# Patient Record
Sex: Male | Born: 1990 | Hispanic: No | Marital: Single | Smoking: Current every day smoker
Health system: Southern US, Community
[De-identification: ages and names within clinical notes are randomized; demographics above are authoritative.]

## PROBLEM LIST (undated history)

## (undated) DIAGNOSIS — Z87442 Personal history of urinary calculi: Secondary | ICD-10-CM

## (undated) DIAGNOSIS — N200 Calculus of kidney: Secondary | ICD-10-CM

## (undated) HISTORY — PX: URETEROLITHOTOMY: SHX71

## (undated) HISTORY — PX: KIDNEY STONE SURGERY: SHX686

---

## 2021-06-28 ENCOUNTER — Emergency Department (HOSPITAL_BASED_OUTPATIENT_CLINIC_OR_DEPARTMENT_OTHER)
Admission: EM | Admit: 2021-06-28 | Discharge: 2021-06-29 | Disposition: A | Discharge status: Home / Self Care | Payer: Self-pay | Attending: Emergency Medicine | Admitting: Emergency Medicine

## 2021-06-28 ENCOUNTER — Other Ambulatory Visit: Payer: Self-pay

## 2021-06-28 ENCOUNTER — Encounter (HOSPITAL_BASED_OUTPATIENT_CLINIC_OR_DEPARTMENT_OTHER): Payer: Self-pay

## 2021-06-28 ENCOUNTER — Emergency Department (HOSPITAL_BASED_OUTPATIENT_CLINIC_OR_DEPARTMENT_OTHER): Payer: Self-pay

## 2021-06-28 DIAGNOSIS — M5020 Other cervical disc displacement, unspecified cervical region: Secondary | ICD-10-CM

## 2021-06-28 DIAGNOSIS — M5031 Other cervical disc degeneration,  high cervical region: Secondary | ICD-10-CM | POA: Insufficient documentation

## 2021-06-28 DIAGNOSIS — R269 Unspecified abnormalities of gait and mobility: Secondary | ICD-10-CM

## 2021-06-28 DIAGNOSIS — Z20822 Contact with and (suspected) exposure to covid-19: Secondary | ICD-10-CM | POA: Insufficient documentation

## 2021-06-28 DIAGNOSIS — M5021 Other cervical disc displacement,  high cervical region: Secondary | ICD-10-CM | POA: Insufficient documentation

## 2021-06-28 DIAGNOSIS — R791 Abnormal coagulation profile: Secondary | ICD-10-CM | POA: Insufficient documentation

## 2021-06-28 DIAGNOSIS — F17210 Nicotine dependence, cigarettes, uncomplicated: Secondary | ICD-10-CM | POA: Insufficient documentation

## 2021-06-28 DIAGNOSIS — M50223 Other cervical disc displacement at C6-C7 level: Secondary | ICD-10-CM | POA: Insufficient documentation

## 2021-06-28 DIAGNOSIS — R202 Paresthesia of skin: Secondary | ICD-10-CM

## 2021-06-28 DIAGNOSIS — Z79899 Other long term (current) drug therapy: Secondary | ICD-10-CM | POA: Insufficient documentation

## 2021-06-28 HISTORY — DX: Calculus of kidney: N20.0

## 2021-06-28 LAB — RESP PANEL BY RT-PCR (FLU A&B, COVID) ARPGX2
Influenza A by PCR: NEGATIVE
Influenza B by PCR: NEGATIVE
SARS Coronavirus 2 by RT PCR: NEGATIVE

## 2021-06-28 LAB — COMPREHENSIVE METABOLIC PANEL
ALT: 52 U/L — ABNORMAL HIGH (ref 0–44)
AST: 38 U/L (ref 15–41)
Albumin: 4.3 g/dL (ref 3.5–5.0)
Alkaline Phosphatase: 80 U/L (ref 38–126)
Anion gap: 10 (ref 5–15)
BUN: 16 mg/dL (ref 6–20)
CO2: 24 mmol/L (ref 22–32)
Calcium: 9.7 mg/dL (ref 8.9–10.3)
Chloride: 104 mmol/L (ref 98–111)
Creatinine, Ser: 0.97 mg/dL (ref 0.61–1.24)
GFR, Estimated: >60 mL/min (ref >60)
Glucose, Bld: 109 mg/dL — ABNORMAL HIGH (ref 70–99)
Potassium: 3.9 mmol/L (ref 3.5–5.1)
Sodium: 138 mmol/L (ref 135–145)
Total Bilirubin: 1.1 mg/dL (ref 0.3–1.2)
Total Protein: 7.3 g/dL (ref 6.5–8.1)

## 2021-06-28 LAB — SALICYLATE LEVEL: Salicylate Lvl: <7 mg/dL — ABNORMAL LOW (ref 7.0–30.0)

## 2021-06-28 LAB — RAPID URINE DRUG SCREEN, HOSP PERFORMED
Amphetamines: NOT DETECTED
Barbiturates: NOT DETECTED
Benzodiazepines: NOT DETECTED
Cocaine: NOT DETECTED
Opiates: NOT DETECTED
Tetrahydrocannabinol: POSITIVE — AB

## 2021-06-28 LAB — URINALYSIS, ROUTINE W REFLEX MICROSCOPIC
Bilirubin Urine: NEGATIVE
Glucose, UA: NEGATIVE mg/dL
Ketones, ur: NEGATIVE mg/dL
Leukocytes,Ua: NEGATIVE
Nitrite: NEGATIVE
Protein, ur: 30 mg/dL — AB
Specific Gravity, Urine: 1.03 (ref 1.005–1.030)
pH: 7 (ref 5.0–8.0)

## 2021-06-28 LAB — URINALYSIS, MICROSCOPIC (REFLEX): WBC, UA: NONE SEEN WBC/hpf (ref 0–5)

## 2021-06-28 LAB — CBC WITH DIFFERENTIAL/PLATELET
Abs Immature Granulocytes: 0.03 10*3/uL (ref 0.00–0.07)
Basophils Absolute: 0.1 10*3/uL (ref 0.0–0.1)
Basophils Relative: 1 %
Eosinophils Absolute: 0.1 10*3/uL (ref 0.0–0.5)
Eosinophils Relative: 2 %
HCT: 51.5 % (ref 39.0–52.0)
Hemoglobin: 19.3 g/dL — ABNORMAL HIGH (ref 13.0–17.0)
Immature Granulocytes: 0 %
Lymphocytes Relative: 31 %
Lymphs Abs: 2.3 10*3/uL (ref 0.7–4.0)
MCH: 33.9 pg (ref 26.0–34.0)
MCHC: 37.5 g/dL — ABNORMAL HIGH (ref 30.0–36.0)
MCV: 90.4 fL (ref 80.0–100.0)
Monocytes Absolute: 0.7 10*3/uL (ref 0.1–1.0)
Monocytes Relative: 9 %
Neutro Abs: 4.2 10*3/uL (ref 1.7–7.7)
Neutrophils Relative %: 57 %
Platelets: 224 10*3/uL (ref 150–400)
RBC: 5.7 MIL/uL (ref 4.22–5.81)
RDW: 12.1 % (ref 11.5–15.5)
WBC: 7.4 10*3/uL (ref 4.0–10.5)
nRBC: 0 % (ref 0.0–0.2)

## 2021-06-28 LAB — MAGNESIUM: Magnesium: 1.8 mg/dL (ref 1.7–2.4)

## 2021-06-28 LAB — PROTIME-INR
INR: 1 (ref 0.8–1.2)
Prothrombin Time: 13.4 seconds (ref 11.4–15.2)

## 2021-06-28 LAB — PHOSPHORUS: Phosphorus: 3.2 mg/dL (ref 2.5–4.6)

## 2021-06-28 LAB — ACETAMINOPHEN LEVEL: Acetaminophen (Tylenol), Serum: <10 ug/mL — ABNORMAL LOW (ref 10–30)

## 2021-06-28 LAB — ETHANOL: Alcohol, Ethyl (B): <10 mg/dL (ref <10)

## 2021-06-28 LAB — LIPASE, BLOOD: Lipase: 21 U/L (ref 11–51)

## 2021-06-28 MED ORDER — SODIUM CHLORIDE 0.9 % IV SOLN: Freq: Once | INTRAVENOUS | Status: AC

## 2021-06-28 MED ORDER — LORAZEPAM 2 MG/ML IJ SOLN
1.0000 mg | Freq: Once | INTRAMUSCULAR | Status: AC | PRN
  Administered 2021-06-29: 1 mg via INTRAVENOUS
  Filled 2021-06-28: qty 1

## 2021-06-28 NOTE — ED Provider Notes (Signed)
[facility] EMERGENCY DEPARTMENT Provider Note   CSN: [id number] Arrival date & time: 06/28/21  1359     History Chief Complaint  Patient presents with   Numbness    Glenn Vasquez is a 30 y.o. male.  HPI Patient is a Naval architect by trade.  He has no known medical problems.  He reports about 25 days ago he started getting tingling and numbness in his fingertips.  He reports it involves all of the fingers of both hands.  Then within about the next 5 days he started to get numbness as well as weakness in his feet and legs.  He reports this gotten increasingly difficult for him to walk.  He is able to walk but it takes a lot more effort and he cannot walk very far.  As well, patient reports he has started to lose control of his bladder.  He reports when he gets urgency to urinate he has to go extremely quickly and sometimes does not make it to the toilet.  He is not having any incontinence of stool.  Patient denies he is having any fevers or chills.  He denies any recent illness over the past number of months.  Patient denies he is having any problems with headaches, neck pain or back pain.  He denies chest pain or abdominal pain.  He does not feel like he has been ill but the symptoms have been progressive.  Patient is a pack per day smoker.  He reports alcohol consumption of about 500 mL of hard liquor daily, except when he is driving and working he reports he has not worked for the past 2 weeks and has been drinking nearly daily.  Last drink was last night.  Patient denies any history of alcohol withdrawal.  He denies any gets tremors, hallucinations or seizures when he does not drink.    Past Medical History:  Diagnosis Date   Kidney stone     There are no problems to display for this patient.   Past Surgical History:  Procedure Laterality Date   KIDNEY STONE SURGERY         No family history on file.  Social History   Tobacco Use   Smoking status: Every Day     Types: Cigarettes   Smokeless tobacco: Never  Vaping Use   Vaping Use: Never used  Substance Use Topics   Alcohol use: Yes    Comment: occ   Drug use: Never    Home Medications Prior to Admission medications   Not on File    Allergies    Patient has no known allergies.  Review of Systems   Review of Systems 10 systems reviewed and negative except as per HPI Physical Exam Updated Vital Signs BP 115/78 (BP Location: Left Arm)   Pulse 82   Temp 98.1 F (36.7 C) (Oral)   Resp 17   Ht 5\' 6"  (1.676 m)   Wt 79.8 kg   SpO2 100%   BMI 28.41 kg/m   Physical Exam Constitutional:      Appearance: Normal appearance.  HENT:     Head: Normocephalic and atraumatic.     Mouth/Throat:     Mouth: Mucous membranes are moist.     Pharynx: Oropharynx is clear.  Eyes:     Extraocular Movements: Extraocular movements intact.     Conjunctiva/sclera: Conjunctivae normal.     Pupils: Pupils are equal, round, and reactive to light.  Neck:     Comments: No  meningismus no lymphadenopathy.  Patient does not have pain with range of motion with forward flexion or side to side rotation Cardiovascular:     Rate and Rhythm: Normal rate and regular rhythm.  Pulmonary:     Effort: Pulmonary effort is normal.     Breath sounds: Normal breath sounds.  Abdominal:     General: There is no distension.     Palpations: Abdomen is soft.     Tenderness: There is no abdominal tenderness. There is no guarding.  Musculoskeletal:        General: No swelling or tenderness. Normal range of motion.     Cervical back: Neck supple. No rigidity.     Right lower leg: No edema.     Left lower leg: No edema.  Skin:    General: Skin is warm and dry.  Neurological:     Mental Status: He is alert.     Comments: Patient is alert.  No signs of confusion.  No somnolence.  Speech is clear.  He speaks some English and also speaks through his companion who is translating.  Extraocular motions are normal.  Pupils are  symmetric responsive.  No cranial nerve deficits.  Finger-nose exam intact bilaterally.  Grip strength possibly slightly diminished 4\5 symmetric.  Lower extremities, patient can hold and elevate each extremity against resistance.  Dorsiflexion and plantarflexion intact.  Patient does have subjective decrease sensation to light touch on the lower extremities bilaterally in the fingers.  Patient is very hyperreflexic bilateral patellar tendons and left biceps tendon.  Right biceps tendon normal 2+.  Psychiatric:        Mood and Affect: Mood normal.    ED Results / Procedures / Treatments   Labs (all labs ordered are listed, but only abnormal results are displayed) Labs Reviewed  COMPREHENSIVE METABOLIC PANEL - Abnormal; Notable for the following components:      Result Value   Glucose, Bld 109 (*)    ALT 52 (*)    All other components within normal limits  SALICYLATE LEVEL - Abnormal; Notable for the following components:   Salicylate Lvl <7.0 (*)    All other components within normal limits  ACETAMINOPHEN LEVEL - Abnormal; Notable for the following components:   Acetaminophen (Tylenol), Serum <10 (*)    All other components within normal limits  CBC WITH DIFFERENTIAL/PLATELET - Abnormal; Notable for the following components:   Hemoglobin 19.3 (*)    MCHC 37.5 (*)    All other components within normal limits  URINALYSIS, ROUTINE W REFLEX MICROSCOPIC - Abnormal; Notable for the following components:   Hgb urine dipstick SMALL (*)    Protein, ur 30 (*)    All other components within normal limits  RAPID URINE DRUG SCREEN, HOSP PERFORMED - Abnormal; Notable for the following components:   Tetrahydrocannabinol POSITIVE (*)    All other components within normal limits  URINALYSIS, MICROSCOPIC (REFLEX) - Abnormal; Notable for the following components:   Bacteria, UA RARE (*)    All other components within normal limits  ETHANOL  LIPASE, BLOOD  PROTIME-INR  MAGNESIUM  PHOSPHORUS  TSH   VITAMIN B12  FOLATE    EKG None  Radiology CT Head Wo Contrast  Result Date: 06/28/2021 CLINICAL DATA:  Acute stroke suspected. EXAM: CT HEAD WITHOUT CONTRAST TECHNIQUE: Contiguous axial images were obtained from the base of the skull through the vertex without intravenous contrast. COMPARISON:  None. FINDINGS: Brain: No evidence of acute infarction, hemorrhage, hydrocephalus, extra-axial collection or mass  lesion/mass effect. Vascular: No hyperdense vessel or unexpected calcification. Skull: Normal. Negative for fracture or focal lesion. Sinuses/Orbits: No acute finding. Other: None. IMPRESSION: No acute intracranial abnormality. Electronically Signed   By: Darliss Cheney M.D.   On: 06/28/2021 20:42    Procedures Procedures   Medications Ordered in ED Medications  0.9 %  sodium chloride infusion ( Intravenous New Bag/Given 06/28/21 1823)    ED Course  I have reviewed the triage vital signs and the nursing notes.  Pertinent labs & imaging results that were available during my care of the patient were reviewed by me and considered in my medical decision making (see chart for details).    MDM Rules/Calculators/A&P                          Consult: Reviewed with Dr. Amada Jupiter.  Recommends MRI cervical and thoracic spine.  Consult: Reviewed with Dr.Zavits EDP at [facility] excepting transfer.  Patient presents with incremental loss of function of both upper and lower extremities.  First patient experienced some paresthesia and numbness of all digits.  Then proceeded to get numbness and weakness of both lower extremities.  On examination, patient does have intact strength for grip on the upper extremities but slightly diminished he does have intact strength for elevating each lower extremity off the bed and resisting downward pressure as well as dorsiflexion plantarflexion.  However, he is extremely hyperreflexic bilateral lower extremities and hyperreflexic left greater than right  upper extremities.  Review of systems does not suggest any infectious etiology.  Patient does not have any IV drug abuse history.  Symptoms have been gradual in onset and present for almost 3 weeks.  At this time, patient will be transferred to [facility] for MRI and further evaluation for gait dysfunction and the paresthesia and numbness in 4 extremities.  Final Clinical Impression(s) / ED Diagnoses Final diagnoses:  Gait difficulty  Paresthesia of both lower extremities  Paresthesia of both hands    Rx / DC Orders ED Discharge Orders     None        Arby Barrette, MD 06/28/21 2112

## 2021-06-28 NOTE — ED Triage Notes (Signed)
Per pt through friend pt with numbness to hands and feet, urinary incontinence x 3-4 weeks-denies spinal injury-denies pain-NAD-steady gait

## 2021-06-29 ENCOUNTER — Emergency Department (HOSPITAL_COMMUNITY): Payer: Self-pay

## 2021-06-29 LAB — TSH: TSH: 1.611 u[IU]/mL (ref 0.350–4.500)

## 2021-06-29 LAB — VITAMIN B12: Vitamin B-12: 173 pg/mL — ABNORMAL LOW (ref 180–914)

## 2021-06-29 LAB — FOLATE: Folate: 7.1 ng/mL (ref >5.9)

## 2021-06-29 IMAGING — MR MR CERVICAL SPINE WO/W CM
9 of 12 series · 27 of 48 positions shown · IV contrast (gadavist)
Comparison: None.

CLINICAL DATA: Tingling and numbness in the fingertips beginning 22
days ago.

EXAM:
MRI CERVICAL AND THORACIC SPINE WITHOUT AND WITH CONTRAST
TECHNIQUE: Multiplanar and multiecho pulse sequences of the cervical spine, to
include the craniocervical junction and cervicothoracic junction,
and the thoracic spine, were obtained without and with intravenous
contrast.
CONTRAST:  8mL GADAVIST GADOBUTROL 1 MMOL/ML IV SOLN

[Series 16: T2 · sagittal · 3.0mm · 0.69mm/px · 2 of 17 slices shown (1 of 2)]
[im 1/17]
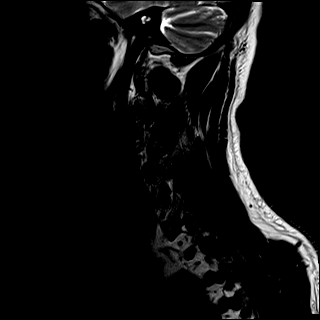
[im 17/17]
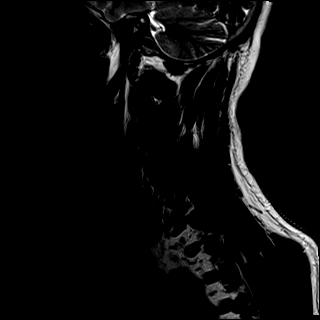

[Series 17: T1 · sagittal · 3.0mm · 0.69mm/px · 2 of 17 slices shown (1 of 2)]
[im 1/17]
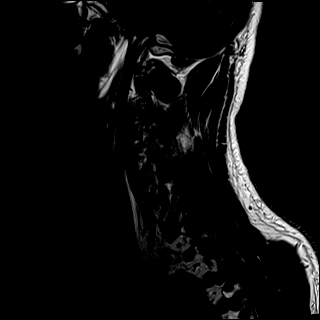
[im 17/17]
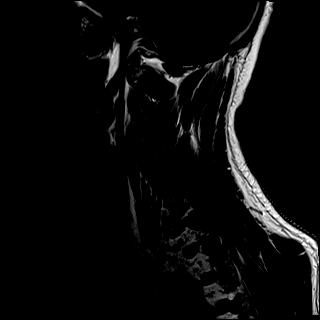

[Series 18: STIR · sagittal · 3.0mm · 0.86mm/px · 2 of 17 slices shown (1 of 2)]
[im 1/17]
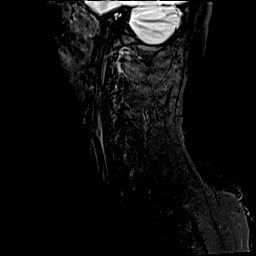
[im 17/17]
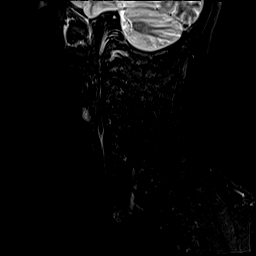

[Series 19: T2 · axial · 3.0mm · 0.74mm/px · z∈[-106,+32]mm · 5 of 43 slices shown (2 of 2)]
[im 1/43]
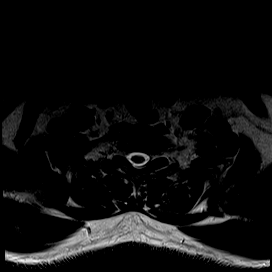
[im 11/43]
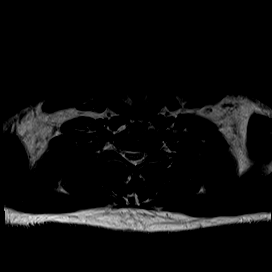
[im 22/43]
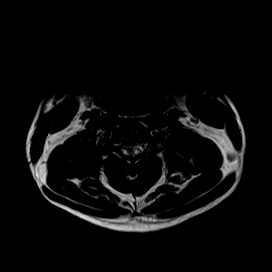
[im 32/43]
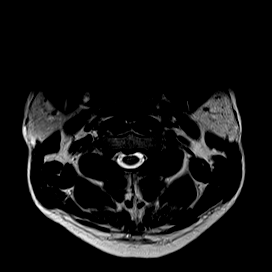
[im 43/43]
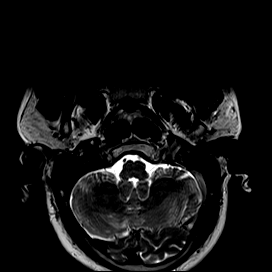

[Series 20: GRE · axial · 3.0mm · 0.39mm/px · z∈[-106,-74]mm · 2 of 44 slices shown]
[im 1/44]
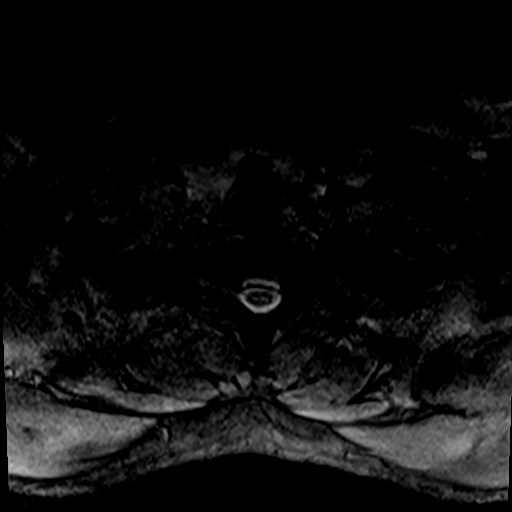
[im 11/44]
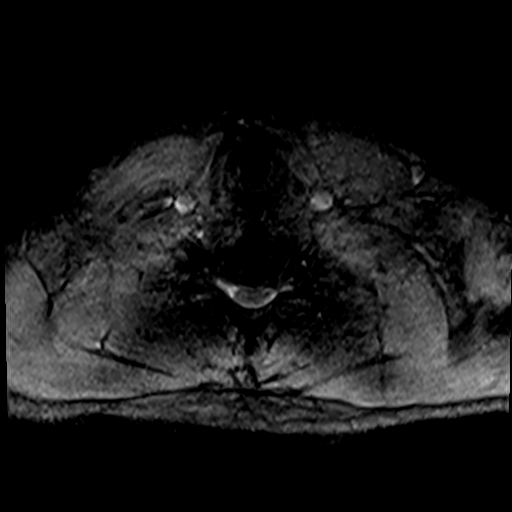

[Series 21: T1 · axial · 3.0mm · 0.39mm/px · z∈[-106,+32]mm · 5 of 44 slices shown (2 of 2)]
[im 1/44]
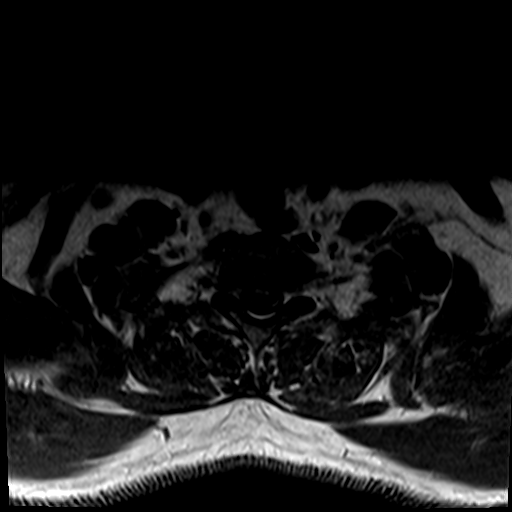
[im 11/44]
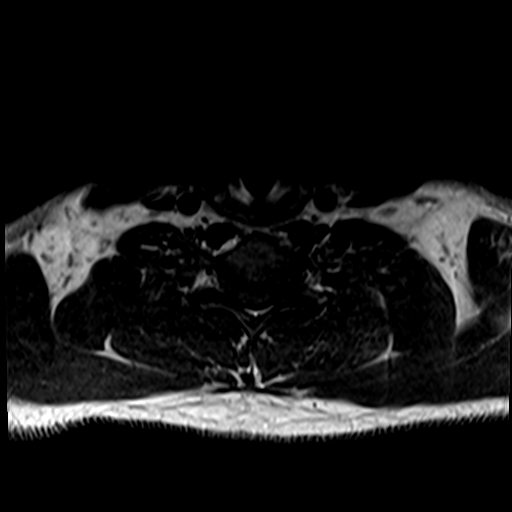
[im 22/44]
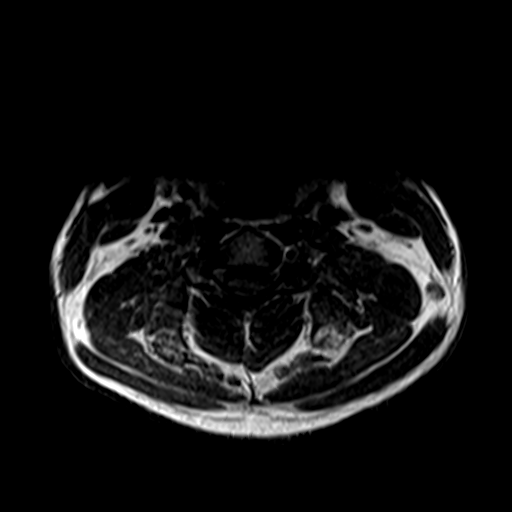
[im 33/44]
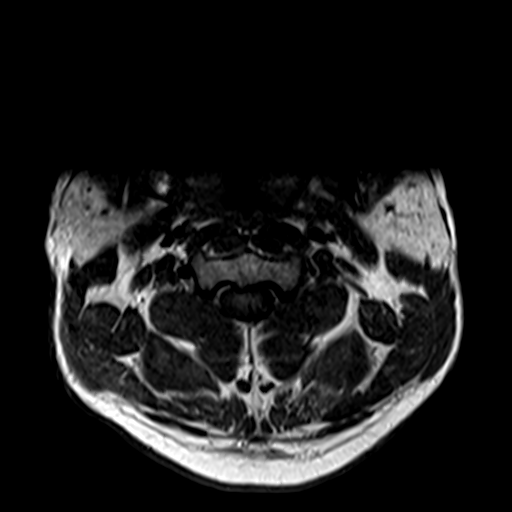
[im 44/44]
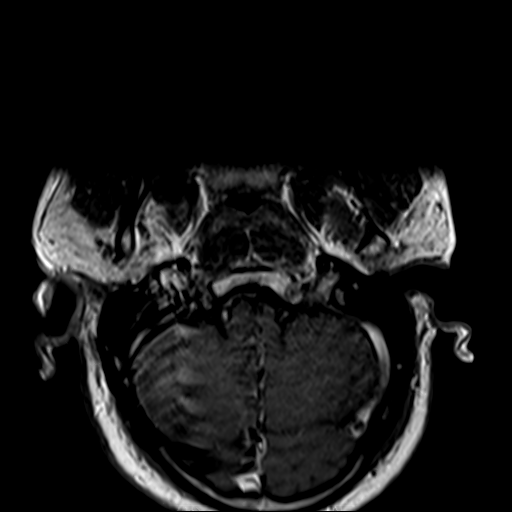

[Series 22: STIR · sagittal · 3.0mm · 0.86mm/px · 2 of 17 slices shown (2 of 2)]
[im 1/17]
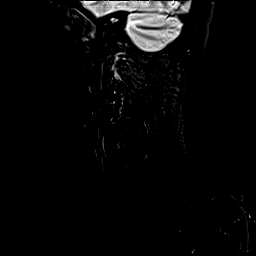
[im 17/17]
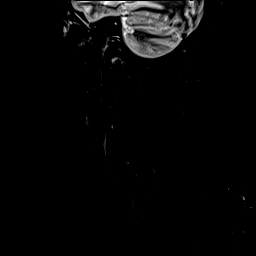

[Series 24: T1 fat-sat post-contrast · sagittal · 3.0mm · 0.43mm/px · 2 of 17 slices shown]
[im 1/17]
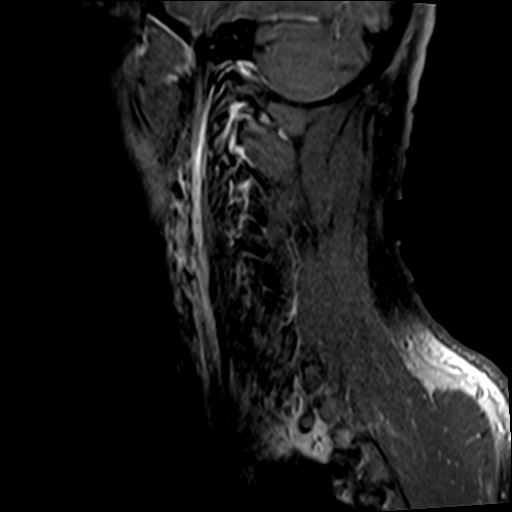
[im 17/17]
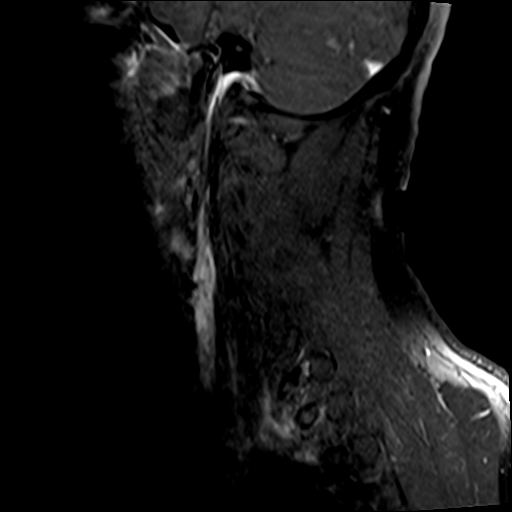

[Series 25: T1 post-contrast · axial · 3.0mm · 0.39mm/px · z∈[-106,+32]mm · 5 of 44 slices shown]
[im 1/44]
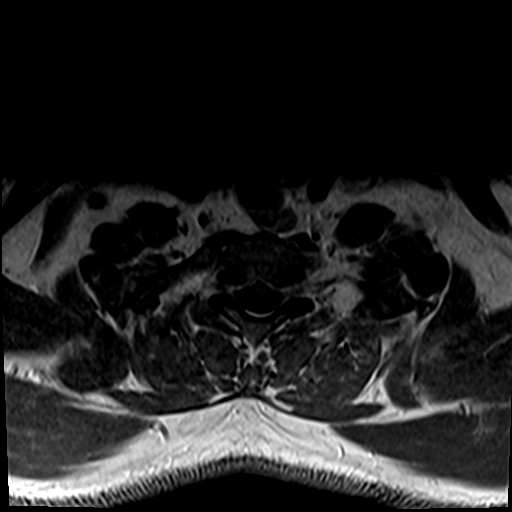
[im 11/44]
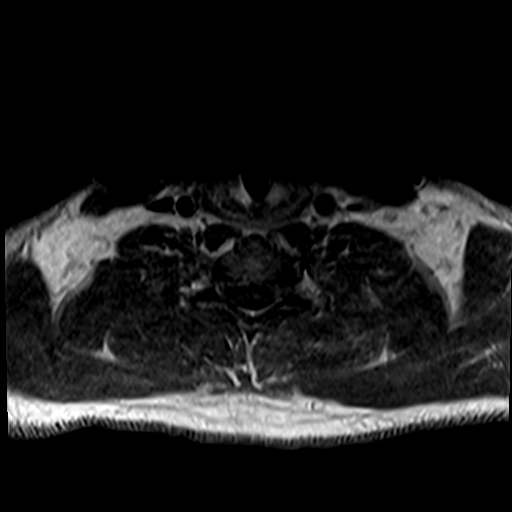
[im 22/44]
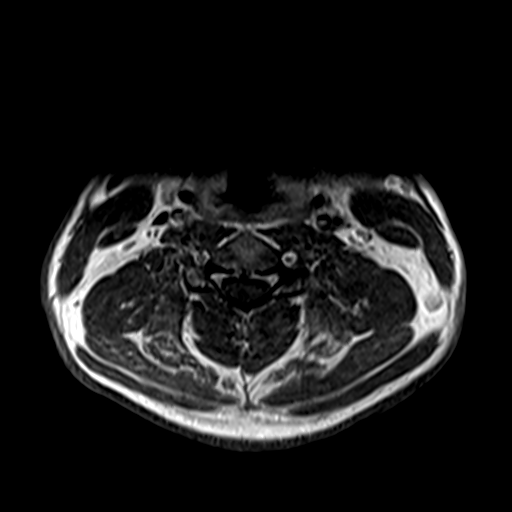
[im 33/44]
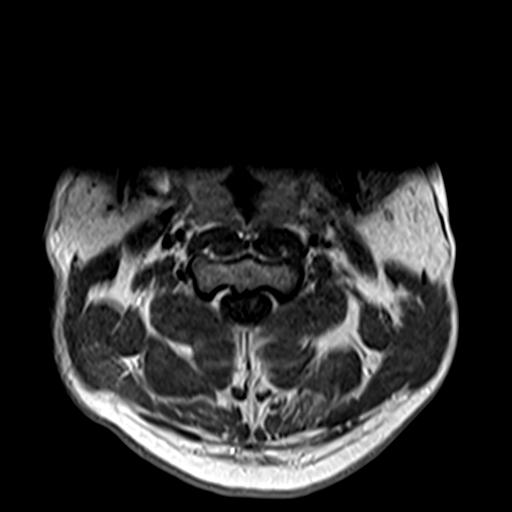
[im 44/44]
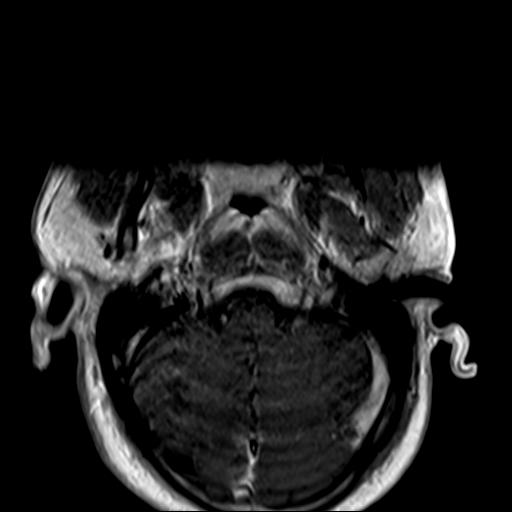

[27 of 48 positions shown; findings below may reference images not displayed]

FINDINGS: MRI CERVICAL SPINE FINDINGS

Alignment: Physiologic.

Vertebrae: No fracture, evidence of discitis, or bone lesion.

Cord: T2 hyperintensity in the bilateral lateral cord at the level
of C3-4 where there is flattening. Associated blood brain barrier
breakdown with enhancement. No additional areas of T2
hyperintensity.

Posterior Fossa, vertebral arteries, paraspinal tissues: No
perispinal mass or inflammation seen. Proteinaceous nasopharyngeal
cyst.

Disc levels:

C2-3: Left paracentral to foraminal protrusion with uncovertebral
spurring and foraminal impingement.

C3-4: Disc narrowing and bulging which is left eccentric with left
foraminal herniation and uncovertebral spurring. Spinal stenosis
that flattens the cord. Severe left foraminal impingement and
moderate right foraminal narrowing.

C4-5: Disc narrowing and bulging with likely mild foraminal stenosis
on the left

C5-6: Mild disc bulging.

C6-7: Disc narrowing and bulging with left foraminal protrusion and
impingement.

C7-T1:Unremarkable.

MRI THORACIC SPINE FINDINGS

Alignment:  Physiologic.

Vertebrae: No fracture, evidence of discitis, or bone lesion.

Cord: Normal signal and morphology. Midthoracic dorsal vessel which
is prominent on sagittal imaging but not on axial slices, likely
within normal limits.

Paraspinal and other soft tissues: Negative. Left renal sinus cysts.

Disc levels:

Disc height and hydration is diffusely preserved. Negative facets.
No neural impingement.
IMPRESSION: Cord edema and enhancement at C3-4 where there is discogenic,
degenerative cord flattening.

Degenerative foraminal impingement is advanced on the left at C2-3
and C3-4.

## 2021-06-29 IMAGING — MR MR THORACIC SPINE WO/W CM
6 of 10 series · 24 of 48 positions shown · IV contrast (gadavist)
Comparison: None.

CLINICAL DATA: Tingling and numbness in the fingertips beginning 22
days ago.

EXAM:
MRI CERVICAL AND THORACIC SPINE WITHOUT AND WITH CONTRAST
TECHNIQUE: Multiplanar and multiecho pulse sequences of the cervical spine, to
include the craniocervical junction and cervicothoracic junction,
and the thoracic spine, were obtained without and with intravenous
contrast.
CONTRAST:  8mL GADAVIST GADOBUTROL 1 MMOL/ML IV SOLN

[Series 2: T1 · sagittal · 3.0mm · 0.62mm/px · 1 of 11 slices shown (1 of 3)]
[im 1/11]
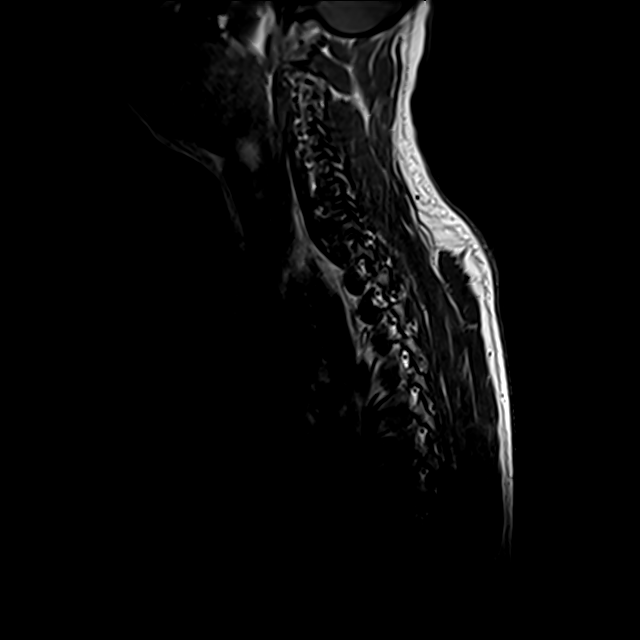

[Series 4: T2 · sagittal · 3.0mm · 0.89mm/px · 3 of 19 slices shown (1 of 2)]
[im 1/19]
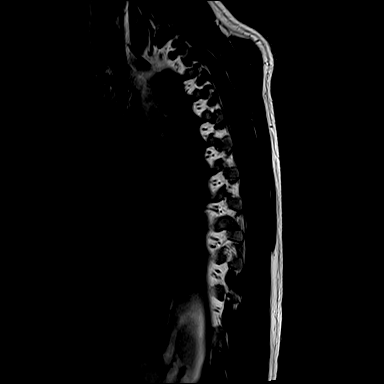
[im 10/19]
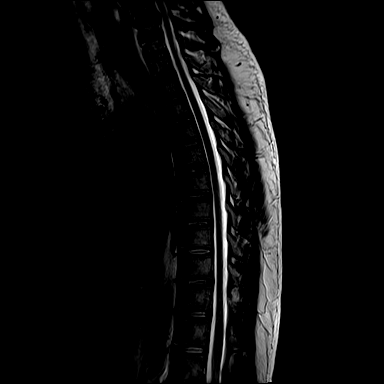
[im 19/19]
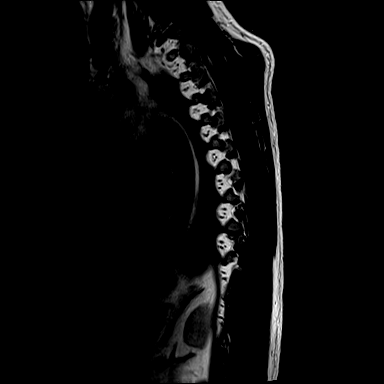

[Series 5: T1 · sagittal · 3.0mm · 0.89mm/px · 3 of 19 slices shown (2 of 3)]
[im 1/19]
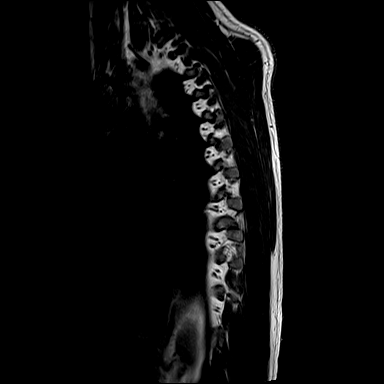
[im 10/19]
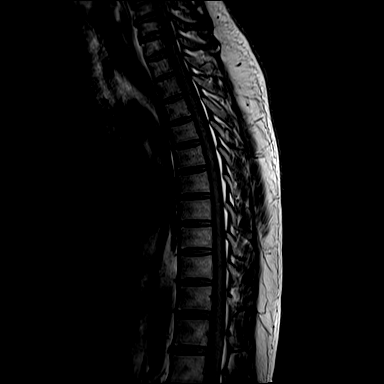
[im 19/19]
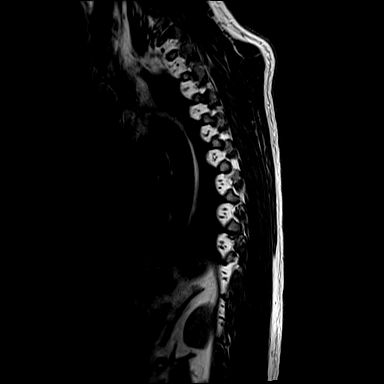

[Series 7: T2 · axial · 5.0mm · 0.66mm/px · z∈[-359,-95]mm · 7 of 39 slices shown (2 of 2)]
[im 1/39]
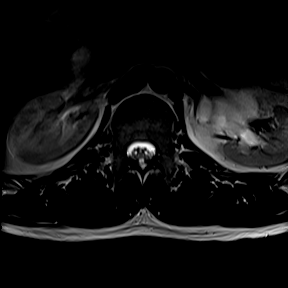
[im 7/39]
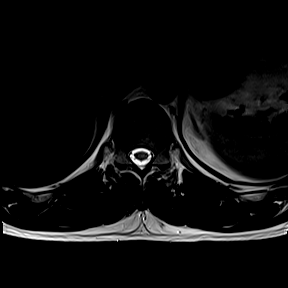
[im 13/39]
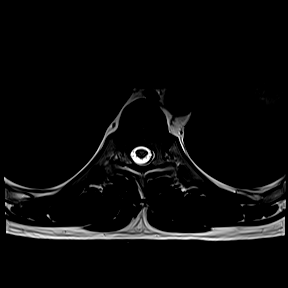
[im 20/39]
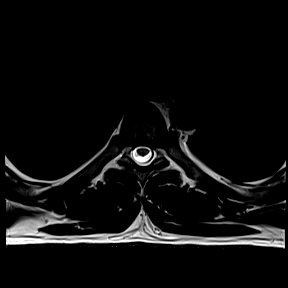
[im 26/39]
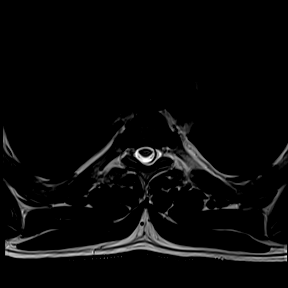
[im 32/39]
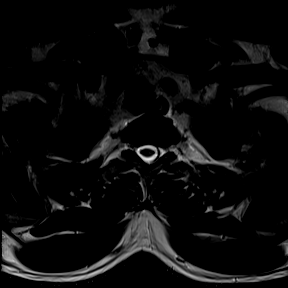
[im 39/39]
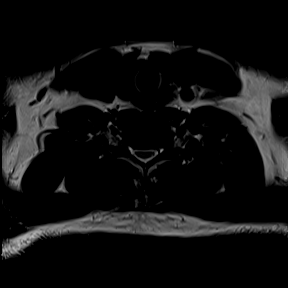

[Series 9: T1 · axial · non-contrast · 5.0mm · 0.35mm/px · z∈[-359,-95]mm · 7 of 39 slices shown (3 of 3)]
[im 1/39]
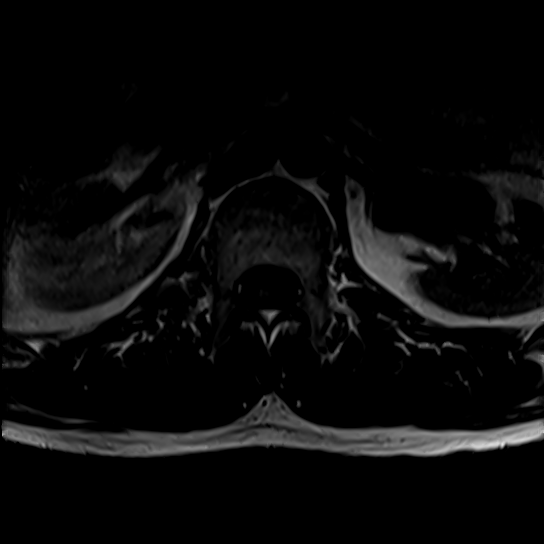
[im 7/39]
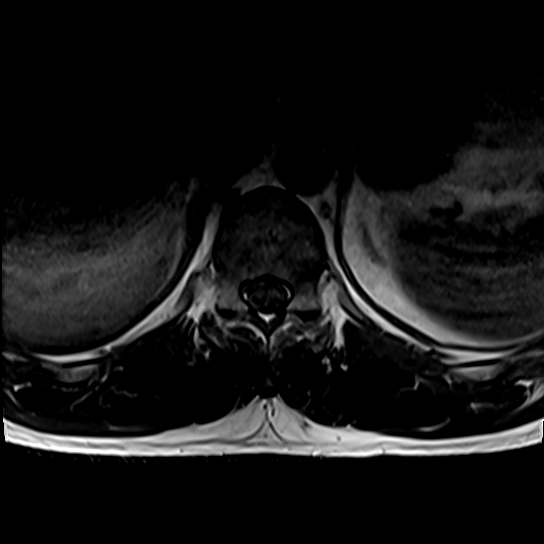
[im 13/39]
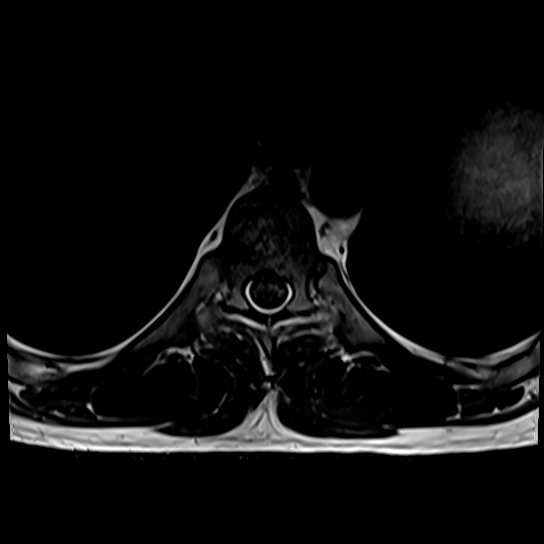
[im 20/39]
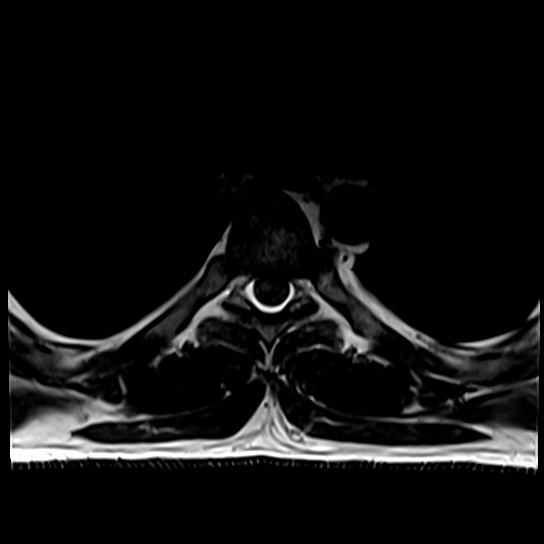
[im 26/39]
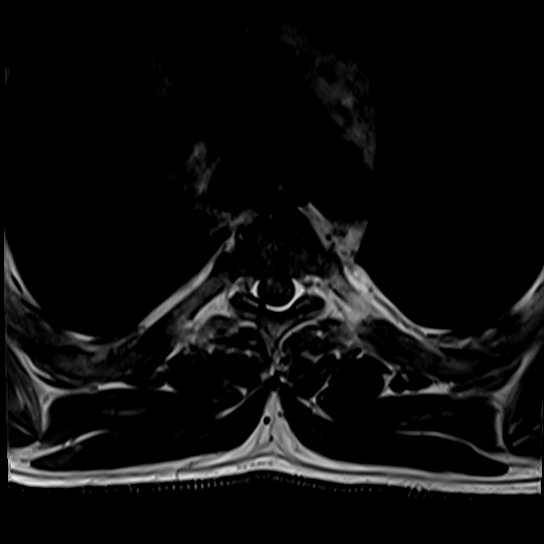
[im 32/39]
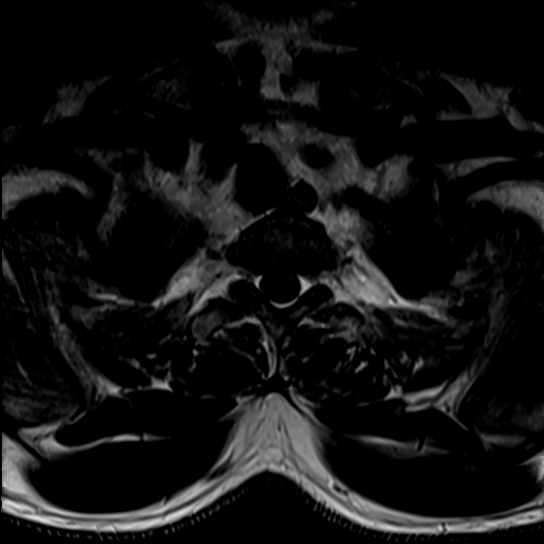
[im 39/39]
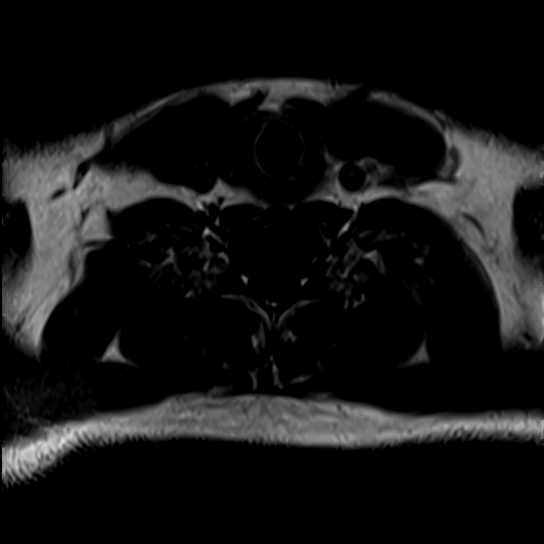

[Series 11: T1 fat-sat post-contrast · sagittal · 3.0mm · 0.89mm/px · 3 of 19 slices shown]
[im 1/19]
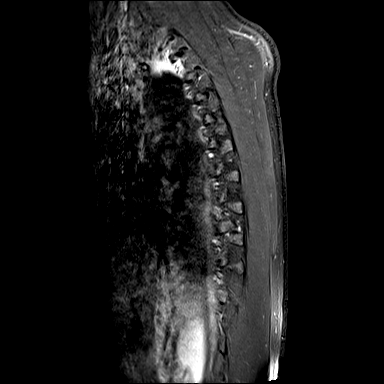
[im 10/19]
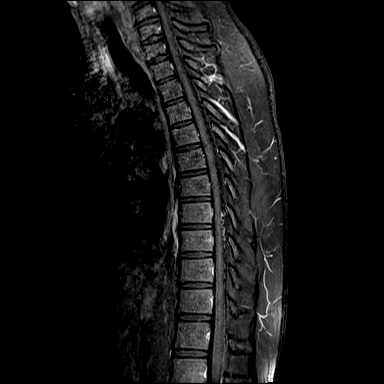
[im 19/19]
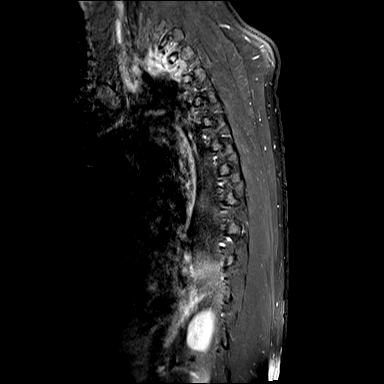

[24 of 48 positions shown; findings below may reference images not displayed]

FINDINGS: MRI CERVICAL SPINE FINDINGS

Alignment: Physiologic.

Vertebrae: No fracture, evidence of discitis, or bone lesion.

Cord: T2 hyperintensity in the bilateral lateral cord at the level
of C3-4 where there is flattening. Associated blood brain barrier
breakdown with enhancement. No additional areas of T2
hyperintensity.

Posterior Fossa, vertebral arteries, paraspinal tissues: No
perispinal mass or inflammation seen. Proteinaceous nasopharyngeal
cyst.

Disc levels:

C2-3: Left paracentral to foraminal protrusion with uncovertebral
spurring and foraminal impingement.

C3-4: Disc narrowing and bulging which is left eccentric with left
foraminal herniation and uncovertebral spurring. Spinal stenosis
that flattens the cord. Severe left foraminal impingement and
moderate right foraminal narrowing.

C4-5: Disc narrowing and bulging with likely mild foraminal stenosis
on the left

C5-6: Mild disc bulging.

C6-7: Disc narrowing and bulging with left foraminal protrusion and
impingement.

C7-T1:Unremarkable.

MRI THORACIC SPINE FINDINGS

Alignment:  Physiologic.

Vertebrae: No fracture, evidence of discitis, or bone lesion.

Cord: Normal signal and morphology. Midthoracic dorsal vessel which
is prominent on sagittal imaging but not on axial slices, likely
within normal limits.

Paraspinal and other soft tissues: Negative. Left renal sinus cysts.

Disc levels:

Disc height and hydration is diffusely preserved. Negative facets.
No neural impingement.
IMPRESSION: Cord edema and enhancement at C3-4 where there is discogenic,
degenerative cord flattening.

Degenerative foraminal impingement is advanced on the left at C2-3
and C3-4.

## 2021-06-29 MED ORDER — METHYLPREDNISOLONE 4 MG PO TBPK: ORAL_TABLET | ORAL | 0 refills | Status: DC

## 2021-06-29 MED ORDER — METHYLPREDNISOLONE 4 MG PO TBPK: 4.0000 mg | ORAL_TABLET | Freq: Four times a day (QID) | ORAL | Status: DC

## 2021-06-29 MED ORDER — METHYLPREDNISOLONE 4 MG PO TBPK: 8.0000 mg | ORAL_TABLET | Freq: Every evening | ORAL | Status: DC

## 2021-06-29 MED ORDER — METHYLPREDNISOLONE 4 MG PO TBPK: 4.0000 mg | ORAL_TABLET | ORAL | Status: DC

## 2021-06-29 MED ORDER — METHYLPREDNISOLONE 4 MG PO TBPK: 8.0000 mg | ORAL_TABLET | Freq: Every morning | ORAL | Status: DC

## 2021-06-29 MED ORDER — GADOBUTROL 1 MMOL/ML IV SOLN
8.0000 mL | Freq: Once | INTRAVENOUS | Status: AC | PRN
  Administered 2021-06-29: 8 mL via INTRAVENOUS

## 2021-06-29 MED ORDER — METHYLPREDNISOLONE 4 MG PO TBPK: 4.0000 mg | ORAL_TABLET | Freq: Three times a day (TID) | ORAL | Status: DC

## 2021-06-29 NOTE — ED Notes (Signed)
Pt taken to MRI  

## 2021-06-29 NOTE — ED Notes (Signed)
This RN spoke to MRI who stated that pt could go next and can go ahead and give meds for MRI

## 2021-06-29 NOTE — Consult Note (Signed)
Reason for Consult: Weakness Referring Physician: Emergency department  Glenn Vasquez is an 30 y.o. male.  HPI: 30 year old male without major medical problems presents with difficulty walking and some increasing numbness and tingling in both hands.  Patient denies any history of trauma or injury.  Patient has some mild neck pain.  He has no other complaints.  Reports some difficulty voiding on one occasion but is not having frank incontinence.  Past Medical History:  Diagnosis Date   Kidney stone     Past Surgical History:  Procedure Laterality Date   KIDNEY STONE SURGERY      No family history on file.  Social History:  reports that he has been smoking cigarettes. He has never used smokeless tobacco. He reports current alcohol use. He reports that he does not use drugs.  Allergies: No Known Allergies  Medications: I have reviewed the patient's current medications.  Results for orders placed or performed during the hospital encounter of 06/28/21 (from the past 48 hour(s))  Comprehensive metabolic panel     Status: Abnormal   Collection Time: 06/28/21  6:14 PM  Result Value Ref Range   Sodium 138 135 - 145 mmol/L   Potassium 3.9 3.5 - 5.1 mmol/L   Chloride 104 98 - 111 mmol/L   CO2 24 22 - 32 mmol/L   Glucose, Bld 109 (H) 70 - 99 mg/dL    Comment: Glucose reference range applies only to samples taken after fasting for at least 8 hours.   BUN 16 6 - 20 mg/dL   Creatinine, Ser 4.09 0.61 - 1.24 mg/dL   Calcium 9.7 8.9 - 81.1 mg/dL   Total Protein 7.3 6.5 - 8.1 g/dL   Albumin 4.3 3.5 - 5.0 g/dL   AST 38 15 - 41 U/L   ALT 52 (H) 0 - 44 U/L   Alkaline Phosphatase 80 38 - 126 U/L   Total Bilirubin 1.1 0.3 - 1.2 mg/dL   GFR, Estimated >91 >47 mL/min    Comment: (NOTE) Calculated using the CKD-EPI Creatinine Equation (2021)    Anion gap 10 5 - 15    Comment: Performed at [facility], [address] [place], [place] [address]  Ethanol     Status: None    Collection Time: 06/28/21  6:14 PM  Result Value Ref Range   Alcohol, Ethyl (B) <10 <10 mg/dL    Comment:        LOWEST DETECTABLE LIMIT FOR SERUM ALCOHOL IS 10 mg/dL FOR MEDICAL PURPOSES ONLY (NOTE) Lowest detectable limit for serum alcohol is 10 mg/dL.  For medical purposes only. Performed at [facility], [address] [place], [place] [address]   Lipase, blood     Status: None   Collection Time: 06/28/21  6:14 PM  Result Value Ref Range   Lipase 21 11 - 51 U/L    Comment: Performed at [facility], [address] [place], [place] [address]  Salicylate level     Status: Abnormal   Collection Time: 06/28/21  6:14 PM  Result Value Ref Range   Salicylate Lvl <7.0 (L) 7.0 - 30.0 mg/dL    Comment: Performed at [facility], [address] [place], [place] [address]  Acetaminophen level     Status: Abnormal   Collection Time: 06/28/21  6:14 PM  Result Value Ref Range   Acetaminophen (Tylenol), Serum <10 (L) 10 - 30 ug/mL    Comment: (NOTE) Therapeutic  concentrations vary significantly. A range of 10-30 ug/mL  may be an effective concentration for many patients. However, some  are best treated at concentrations outside of this range. Acetaminophen concentrations >150 ug/mL at 4 hours after ingestion  and >50 ug/mL at 12 hours after ingestion are often associated with  toxic reactions.  Performed at [facility], [address] [place], [place] [address]   CBC with Differential     Status: Abnormal   Collection Time: 06/28/21  6:14 PM  Result Value Ref Range   WBC 7.4 4.0 - 10.5 K/uL   RBC 5.70 4.22 - 5.81 MIL/uL   Hemoglobin 19.3 (H) 13.0 - 17.0 g/dL   HCT 93.7 90.2 - 40.9 %   MCV 90.4 80.0 - 100.0 fL   MCH 33.9 26.0 - 34.0 pg   MCHC 37.5 (H) 30.0 - 36.0 g/dL   RDW 73.5 32.9 - 92.4 %   Platelets 224 150 - 400 K/uL   nRBC 0.0 0.0 - 0.2 %   Neutrophils Relative % 57 %   Neutro Abs 4.2 1.7 - 7.7 K/uL   Lymphocytes  Relative 31 %   Lymphs Abs 2.3 0.7 - 4.0 K/uL   Monocytes Relative 9 %   Monocytes Absolute 0.7 0.1 - 1.0 K/uL   Eosinophils Relative 2 %   Eosinophils Absolute 0.1 0.0 - 0.5 K/uL   Basophils Relative 1 %   Basophils Absolute 0.1 0.0 - 0.1 K/uL   Immature Granulocytes 0 %   Abs Immature Granulocytes 0.03 0.00 - 0.07 K/uL    Comment: Performed at [facility], [address] [place], [place] [address]  Protime-INR     Status: None   Collection Time: 06/28/21  6:14 PM  Result Value Ref Range   Prothrombin Time 13.4 11.4 - 15.2 seconds   INR 1.0 0.8 - 1.2    Comment: (NOTE) INR goal varies based on device and disease states. Performed at [facility], [address] [place], [place] [address]   Urinalysis, Routine w reflex microscopic     Status: Abnormal   Collection Time: 06/28/21  6:14 PM  Result Value Ref Range   Color, Urine YELLOW YELLOW   APPearance CLEAR CLEAR   Specific Gravity, Urine 1.030 1.005 - 1.030   pH 7.0 5.0 - 8.0   Glucose, UA NEGATIVE NEGATIVE mg/dL   Hgb urine dipstick SMALL (A) NEGATIVE   Bilirubin Urine NEGATIVE NEGATIVE   Ketones, ur NEGATIVE NEGATIVE mg/dL   Protein, ur 30 (A) NEGATIVE mg/dL   Nitrite NEGATIVE NEGATIVE   Leukocytes,Ua NEGATIVE NEGATIVE    Comment: Performed at [facility], [address] [place], [place] [address]  Urine rapid drug screen (hosp performed)     Status: Abnormal   Collection Time: 06/28/21  6:14 PM  Result Value Ref Range   Opiates NONE DETECTED NONE DETECTED   Cocaine NONE DETECTED NONE DETECTED   Benzodiazepines NONE DETECTED NONE DETECTED   Amphetamines NONE DETECTED NONE DETECTED   Tetrahydrocannabinol POSITIVE (A) NONE DETECTED   Barbiturates NONE DETECTED NONE DETECTED    Comment: (NOTE) DRUG SCREEN FOR MEDICAL PURPOSES ONLY.  IF CONFIRMATION IS NEEDED FOR ANY PURPOSE, NOTIFY LAB WITHIN 5 DAYS.  LOWEST DETECTABLE LIMITS FOR URINE DRUG SCREEN Drug Class                      Cutoff (ng/mL) Amphetamine and metabolites    1000  Barbiturate and metabolites    200 Benzodiazepine                 200 Tricyclics and metabolites     300 Opiates and metabolites        300 Cocaine and metabolites        300 THC                            50 Performed at [facility], [address] [place], [place] [address]   Magnesium     Status: None   Collection Time: 06/28/21  6:14 PM  Result Value Ref Range   Magnesium 1.8 1.7 - 2.4 mg/dL    Comment: Performed at [facility], [address] [place], [place] [address]  Phosphorus     Status: None   Collection Time: 06/28/21  6:14 PM  Result Value Ref Range   Phosphorus 3.2 2.5 - 4.6 mg/dL    Comment: Performed at [facility], [address] [place], [place] [address]  Urinalysis, Microscopic (reflex)     Status: Abnormal   Collection Time: 06/28/21  6:14 PM  Result Value Ref Range   RBC / HPF 0-5 0 - 5 RBC/hpf   WBC, UA NONE SEEN 0 - 5 WBC/hpf   Bacteria, UA RARE (A) NONE SEEN   Squamous Epithelial / LPF 0-5 0 - 5    Comment: Performed at [facility], [address] [place], [place] [address]  TSH     Status: None   Collection Time: 06/28/21  6:15 PM  Result Value Ref Range   TSH 1.611 0.350 - 4.500 uIU/mL    Comment: Performed by a 3rd Generation assay with a functional sensitivity of <=0.01 uIU/mL. Performed at [facility], [address], [place], [place] [address]   Vitamin B12     Status: Abnormal   Collection Time: 06/28/21  6:15 PM  Result Value Ref Range   Vitamin B-12 173 (L) 180 - 914 pg/mL    Comment: (NOTE) This assay is not validated for testing neonatal or myeloproliferative syndrome specimens for Vitamin B12 levels. Performed at [facility], [address], [place], [place] [address]   Folate, serum, performed at [facility] lab     Status: None   Collection Time: 06/28/21  6:15 PM  Result Value Ref Range   Folate 7.1 >5.9 ng/mL     Comment: Performed at [facility], [address], [place], [place] [address]  Resp Panel by RT-PCR (Flu A&B, Covid) Nasopharyngeal Swab     Status: None   Collection Time: 06/28/21  9:35 PM   Specimen: Nasopharyngeal Swab; Nasopharyngeal(NP) swabs in vial transport medium  Result Value Ref Range   SARS Coronavirus 2 by RT PCR NEGATIVE NEGATIVE    Comment: (NOTE) SARS-CoV-2 target nucleic acids are NOT DETECTED.  The SARS-CoV-2 RNA is generally detectable in upper respiratory specimens during the acute phase of infection. The lowest concentration of SARS-CoV-2 viral copies this assay can detect is 138 copies/mL. A negative result does not preclude SARS-Cov-2 infection and should not be used as the sole basis for treatment or other patient management decisions. A negative result may occur with  improper specimen collection/handling, submission of specimen other than nasopharyngeal swab, presence of viral mutation(s) within the areas targeted by this assay, and inadequate number of viral  copies(<138 copies/mL). A negative result must be combined with clinical observations, patient history, and epidemiological information. The expected result is Negative.  Fact Sheet for Patients:  BloggerCourse.com  Fact Sheet for Healthcare Providers:  SeriousBroker.it  This test is no t yet approved or cleared by the [place] FDA and  has been authorized for detection and/or diagnosis of SARS-CoV-2 by FDA under an Emergency Use Authorization (EUA). This EUA will remain  in effect (meaning this test can be used) for the duration of the COVID-19 declaration under Section 564(b)(1) of the Act, 21 U.S.C.section 360bbb-3(b)(1), unless the authorization is terminated  or revoked sooner.       Influenza A by PCR NEGATIVE NEGATIVE   Influenza B by PCR NEGATIVE NEGATIVE    Comment: (NOTE) The Xpert Xpress SARS-CoV-2/FLU/RSV plus assay is  intended as an aid in the diagnosis of influenza from Nasopharyngeal swab specimens and should not be used as a sole basis for treatment. Nasal washings and aspirates are unacceptable for Xpert Xpress SARS-CoV-2/FLU/RSV testing.  Fact Sheet for Patients: BloggerCourse.com  Fact Sheet for Healthcare Providers: SeriousBroker.it  This test is not yet approved or cleared by the [place] FDA and has been authorized for detection and/or diagnosis of SARS-CoV-2 by FDA under an Emergency Use Authorization (EUA). This EUA will remain in effect (meaning this test can be used) for the duration of the COVID-19 declaration under Section 564(b)(1) of the Act, 21 U.S.C. section 360bbb-3(b)(1), unless the authorization is terminated or revoked.  Performed at [facility], [address] [place], [place] [address]     CT Head Wo Contrast  Result Date: 06/28/2021 CLINICAL DATA:  Acute stroke suspected. EXAM: CT HEAD WITHOUT CONTRAST TECHNIQUE: Contiguous axial images were obtained from the base of the skull through the vertex without intravenous contrast. COMPARISON:  None. FINDINGS: Brain: No evidence of acute infarction, hemorrhage, hydrocephalus, extra-axial collection or mass lesion/mass effect. Vascular: No hyperdense vessel or unexpected calcification. Skull: Normal. Negative for fracture or focal lesion. Sinuses/Orbits: No acute finding. Other: None. IMPRESSION: No acute intracranial abnormality. Electronically Signed   By: Glenn Vasquez M.D.   On: 06/28/2021 20:42   MR Cervical Spine W or Wo Contrast  Result Date: 06/29/2021 CLINICAL DATA:  Tingling and numbness in the fingertips beginning 22 days ago. EXAM: MRI CERVICAL AND THORACIC SPINE WITHOUT AND WITH CONTRAST TECHNIQUE: Multiplanar and multiecho pulse sequences of the cervical spine, to include the craniocervical junction and cervicothoracic junction, and the thoracic  spine, were obtained without and with intravenous contrast. CONTRAST:  57mL GADAVIST GADOBUTROL 1 MMOL/ML IV SOLN COMPARISON:  None. FINDINGS: MRI CERVICAL SPINE FINDINGS Alignment: Physiologic. Vertebrae: No fracture, evidence of discitis, or bone lesion. Cord: T2 hyperintensity in the bilateral lateral cord at the level of C3-4 where there is flattening. Associated blood brain barrier breakdown with enhancement. No additional areas of T2 hyperintensity. Posterior Fossa, vertebral arteries, paraspinal tissues: No perispinal mass or inflammation seen. Proteinaceous nasopharyngeal cyst. Disc levels: C2-3: Left paracentral to foraminal protrusion with uncovertebral spurring and foraminal impingement. C3-4: Disc narrowing and bulging which is left eccentric with left foraminal herniation and uncovertebral spurring. Spinal stenosis that flattens the cord. Severe left foraminal impingement and moderate right foraminal narrowing. C4-5: Disc narrowing and bulging with likely mild foraminal stenosis on the left C5-6: Mild disc bulging. C6-7: Disc narrowing and bulging with left foraminal protrusion and impingement. C7-T1:Unremarkable. MRI THORACIC SPINE FINDINGS Alignment:  Physiologic. Vertebrae: No fracture, evidence of discitis, or bone lesion.  Cord: Normal signal and morphology. Midthoracic dorsal vessel which is prominent on sagittal imaging but not on axial slices, likely within normal limits. Paraspinal and other soft tissues: Negative. Left renal sinus cysts. Disc levels: Disc height and hydration is diffusely preserved. Negative facets. No neural impingement. IMPRESSION: Cord edema and enhancement at C3-4 where there is discogenic, degenerative cord flattening. Degenerative foraminal impingement is advanced on the left at C2-3 and C3-4. Electronically Signed   By: Glenn Vasquez M.D.   On: 06/29/2021 07:21   MR THORACIC SPINE W WO CONTRAST  Result Date: 06/29/2021 CLINICAL DATA:  Tingling and numbness in the  fingertips beginning 22 days ago. EXAM: MRI CERVICAL AND THORACIC SPINE WITHOUT AND WITH CONTRAST TECHNIQUE: Multiplanar and multiecho pulse sequences of the cervical spine, to include the craniocervical junction and cervicothoracic junction, and the thoracic spine, were obtained without and with intravenous contrast. CONTRAST:  5mL GADAVIST GADOBUTROL 1 MMOL/ML IV SOLN COMPARISON:  None. FINDINGS: MRI CERVICAL SPINE FINDINGS Alignment: Physiologic. Vertebrae: No fracture, evidence of discitis, or bone lesion. Cord: T2 hyperintensity in the bilateral lateral cord at the level of C3-4 where there is flattening. Associated blood brain barrier breakdown with enhancement. No additional areas of T2 hyperintensity. Posterior Fossa, vertebral arteries, paraspinal tissues: No perispinal mass or inflammation seen. Proteinaceous nasopharyngeal cyst. Disc levels: C2-3: Left paracentral to foraminal protrusion with uncovertebral spurring and foraminal impingement. C3-4: Disc narrowing and bulging which is left eccentric with left foraminal herniation and uncovertebral spurring. Spinal stenosis that flattens the cord. Severe left foraminal impingement and moderate right foraminal narrowing. C4-5: Disc narrowing and bulging with likely mild foraminal stenosis on the left C5-6: Mild disc bulging. C6-7: Disc narrowing and bulging with left foraminal protrusion and impingement. C7-T1:Unremarkable. MRI THORACIC SPINE FINDINGS Alignment:  Physiologic. Vertebrae: No fracture, evidence of discitis, or bone lesion. Cord: Normal signal and morphology. Midthoracic dorsal vessel which is prominent on sagittal imaging but not on axial slices, likely within normal limits. Paraspinal and other soft tissues: Negative. Left renal sinus cysts. Disc levels: Disc height and hydration is diffusely preserved. Negative facets. No neural impingement. IMPRESSION: Cord edema and enhancement at C3-4 where there is discogenic, degenerative cord flattening.  Degenerative foraminal impingement is advanced on the left at C2-3 and C3-4. Electronically Signed   By: Glenn Vasquez M.D.   On: 06/29/2021 07:21    Pertinent items noted in HPI and remainder of comprehensive ROS otherwise negative. Blood pressure 109/69, pulse (!) 105, temperature 98.3 F (36.8 C), temperature source Oral, resp. rate 14, height 5\' 6"  (1.676 m), weight 79.8 kg, SpO2 99 %. Patient is awake and alert.  He is oriented and appropriate his cranial nerve function is intact.  English is his second language and his brother is at bedside to help with the history and examination.  His cranial nerve function is normal bilaterally.  Motor examination extremities reveal some mild weakness of his intrinsics in both hands and some mild grip strength loss otherwise motor strength in his upper extremities is grossly normal.  Lower extremity strength is normal to direct testing.  He does have Hoffmann's responses in both hands.  He has diminished sensation distally in both upper extremities.  Toes are equivocal to plantar stimulation.  Examination head ears eyes nose throat is unremarked.  Chest and abdomen are benign.  Extremities are free of major deformity.  Neck is supple.  There is no spasm or bony abnormality.  Airway is normal.  Pulses are normal bilaterally.  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  He has some spondylitic change on the left at C2-3 with some moderate stenosis here as well.  Remainder the cervical spine demonstrates no evidence of disc disease.  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  Thoracic MRI scanning is normal.  The patient has significant C3-4 stenosis with cord compression and high signal abnormality and ongoing symptoms of cervical myelopathy which is recently worsened.  At this point I recommend  immobilization in a soft collar and treatment with an oral steroid Dosepak.  I would like to give the patient's edema a chance to stabilize and I will plan on seeing him back in the office in 1 week at which point we will talk about options for surgical decompression for treatment of his condition.  He and his brother then given instructions to call me should his situation worsen at all.  Glenn Vasquez 06/29/2021, 12:08 PM

## 2021-06-29 NOTE — ED Provider Notes (Addendum)
MRI findings reviewed.  Patient has cord edema and enhancement at C3-C4 where there is discogenic degenerative cord flattening.  I will consult with neurosurgery   Case discussed with Dr Jordan Likes.  He will come evaluate the patient in the ED.  Dr. Jordan Likes evaluated the patient in the emergency department.  He plans on having the patient follow-up for surgery as an outpatient.  Will discharge home in a soft collar and have him take a course of steroids CT Head Wo Contrast  Result Date: 06/28/2021 CLINICAL DATA:  Acute stroke suspected. EXAM: CT HEAD WITHOUT CONTRAST TECHNIQUE: Contiguous axial images were obtained from the base of the skull through the vertex without intravenous contrast. COMPARISON:  None. FINDINGS: Brain: No evidence of acute infarction, hemorrhage, hydrocephalus, extra-axial collection or mass lesion/mass effect. Vascular: No hyperdense vessel or unexpected calcification. Skull: Normal. Negative for fracture or focal lesion. Sinuses/Orbits: No acute finding. Other: None. IMPRESSION: No acute intracranial abnormality. Electronically Signed   By: Darliss Cheney M.D.   On: 06/28/2021 20:42   MR Cervical Spine W or Wo Contrast  Result Date: 06/29/2021 CLINICAL DATA:  Tingling and numbness in the fingertips beginning 22 days ago. EXAM: MRI CERVICAL AND THORACIC SPINE WITHOUT AND WITH CONTRAST TECHNIQUE: Multiplanar and multiecho pulse sequences of the cervical spine, to include the craniocervical junction and cervicothoracic junction, and the thoracic spine, were obtained without and with intravenous contrast. CONTRAST:  46mL GADAVIST GADOBUTROL 1 MMOL/ML IV SOLN COMPARISON:  None. FINDINGS: MRI CERVICAL SPINE FINDINGS Alignment: Physiologic. Vertebrae: No fracture, evidence of discitis, or bone lesion. Cord: T2 hyperintensity in the bilateral lateral cord at the level of C3-4 where there is flattening. Associated blood brain barrier breakdown with enhancement. No additional areas of T2  hyperintensity. Posterior Fossa, vertebral arteries, paraspinal tissues: No perispinal mass or inflammation seen. Proteinaceous nasopharyngeal cyst. Disc levels: C2-3: Left paracentral to foraminal protrusion with uncovertebral spurring and foraminal impingement. C3-4: Disc narrowing and bulging which is left eccentric with left foraminal herniation and uncovertebral spurring. Spinal stenosis that flattens the cord. Severe left foraminal impingement and moderate right foraminal narrowing. C4-5: Disc narrowing and bulging with likely mild foraminal stenosis on the left C5-6: Mild disc bulging. C6-7: Disc narrowing and bulging with left foraminal protrusion and impingement. C7-T1:Unremarkable. MRI THORACIC SPINE FINDINGS Alignment:  Physiologic. Vertebrae: No fracture, evidence of discitis, or bone lesion. Cord: Normal signal and morphology. Midthoracic dorsal vessel which is prominent on sagittal imaging but not on axial slices, likely within normal limits. Paraspinal and other soft tissues: Negative. Left renal sinus cysts. Disc levels: Disc height and hydration is diffusely preserved. Negative facets. No neural impingement. IMPRESSION: Cord edema and enhancement at C3-4 where there is discogenic, degenerative cord flattening. Degenerative foraminal impingement is advanced on the left at C2-3 and C3-4. Electronically Signed   By: Tiburcio Pea M.D.   On: 06/29/2021 07:21   MR THORACIC SPINE W WO CONTRAST  Result Date: 06/29/2021 CLINICAL DATA:  Tingling and numbness in the fingertips beginning 22 days ago. EXAM: MRI CERVICAL AND THORACIC SPINE WITHOUT AND WITH CONTRAST TECHNIQUE: Multiplanar and multiecho pulse sequences of the cervical spine, to include the craniocervical junction and cervicothoracic junction, and the thoracic spine, were obtained without and with intravenous contrast. CONTRAST:  37mL GADAVIST GADOBUTROL 1 MMOL/ML IV SOLN COMPARISON:  None. FINDINGS: MRI CERVICAL SPINE FINDINGS Alignment:  Physiologic. Vertebrae: No fracture, evidence of discitis, or bone lesion. Cord: T2 hyperintensity in the bilateral lateral cord at the level of C3-4 where there  is flattening. Associated blood brain barrier breakdown with enhancement. No additional areas of T2 hyperintensity. Posterior Fossa, vertebral arteries, paraspinal tissues: No perispinal mass or inflammation seen. Proteinaceous nasopharyngeal cyst. Disc levels: C2-3: Left paracentral to foraminal protrusion with uncovertebral spurring and foraminal impingement. C3-4: Disc narrowing and bulging which is left eccentric with left foraminal herniation and uncovertebral spurring. Spinal stenosis that flattens the cord. Severe left foraminal impingement and moderate right foraminal narrowing. C4-5: Disc narrowing and bulging with likely mild foraminal stenosis on the left C5-6: Mild disc bulging. C6-7: Disc narrowing and bulging with left foraminal protrusion and impingement. C7-T1:Unremarkable. MRI THORACIC SPINE FINDINGS Alignment:  Physiologic. Vertebrae: No fracture, evidence of discitis, or bone lesion. Cord: Normal signal and morphology. Midthoracic dorsal vessel which is prominent on sagittal imaging but not on axial slices, likely within normal limits. Paraspinal and other soft tissues: Negative. Left renal sinus cysts. Disc levels: Disc height and hydration is diffusely preserved. Negative facets. No neural impingement. IMPRESSION: Cord edema and enhancement at C3-4 where there is discogenic, degenerative cord flattening. Degenerative foraminal impingement is advanced on the left at C2-3 and C3-4. Electronically Signed   By: Tiburcio Pea M.D.   On: 06/29/2021 07:21          Linwood Dibbles, MD 06/29/21 1218

## 2021-06-29 NOTE — Discharge Instructions (Signed)
Take the steroids as prescribed to help with the swelling and inflammation.  Follow-up with Dr. Jordan Likes to arrange for surgery

## 2021-06-29 NOTE — ED Provider Notes (Signed)
Patient arrived in transfer.  Concern for possible spinal pathology plan for MRI of the C and T-spine.  Patient with some progressive difficulty with ambulation.  Drinks daily.  On my exam the patient has hyperreflexia in all 4 extremities.  I do not appreciate any clonus.  Awaiting MRI.   Melene Plan, DO 06/29/21 2301

## 2021-07-07 ENCOUNTER — Other Ambulatory Visit: Payer: Self-pay | Admitting: Neurosurgery

## 2021-07-08 ENCOUNTER — Other Ambulatory Visit: Payer: Self-pay

## 2021-07-08 ENCOUNTER — Encounter (HOSPITAL_COMMUNITY): Payer: Self-pay | Admitting: Neurosurgery

## 2021-07-08 NOTE — Progress Notes (Addendum)
I spoke to [employer] using [address] [place], [place] # [phone].  Mr. Yale denies chest pain or shortness of breath or shortness of breath. Patient denies having any s/s of Covid in his household.  Patient denies any known exposure to Covid.   Mr. Wenner does not have a PCP.  I instructed patient to shower with antibiotic soap, if it is available.  Dry off with a clean towel. Do not put lotion, powder, cologne or deodorant or makeup.No jewelry or piercings. Men may shave their face and neck. Woman should not shave. No nail polish, artificial or acrylic nails. Wear clean clothes, brush your teeth. Glasses, contact lens,dentures or partials may not be worn in the OR. If you need to wear them, please bring a case for glasses, do not wear contacts or bring a case, the hospital does not have contact cases, dentures or partials will have to be removed , make sure they are clean, we will provide a denture cup to put them in. You will need some one to drive you home and a responsible person over the age of 23 to stay with you for the first 24 hours after surgery.

## 2021-07-09 ENCOUNTER — Ambulatory Visit (HOSPITAL_COMMUNITY): Payer: Self-pay

## 2021-07-09 ENCOUNTER — Ambulatory Visit (HOSPITAL_COMMUNITY): Payer: Self-pay | Admitting: Anesthesiology

## 2021-07-09 ENCOUNTER — Encounter (HOSPITAL_COMMUNITY): Payer: Self-pay | Admitting: Neurosurgery

## 2021-07-09 ENCOUNTER — Other Ambulatory Visit: Payer: Self-pay

## 2021-07-09 ENCOUNTER — Observation Stay (HOSPITAL_COMMUNITY)
Admission: RE | Admit: 2021-07-09 | Discharge: 2021-07-10 | Disposition: A | Discharge status: Home / Self Care | Payer: Self-pay | Attending: Neurosurgery | Admitting: Neurosurgery

## 2021-07-09 ENCOUNTER — Encounter (HOSPITAL_COMMUNITY)
Admission: RE | Disposition: A | Discharge status: Home / Self Care | Payer: Self-pay | Source: Home / Self Care | Attending: Neurosurgery

## 2021-07-09 DIAGNOSIS — F17210 Nicotine dependence, cigarettes, uncomplicated: Secondary | ICD-10-CM | POA: Insufficient documentation

## 2021-07-09 DIAGNOSIS — G959 Disease of spinal cord, unspecified: Secondary | ICD-10-CM | POA: Diagnosis present

## 2021-07-09 DIAGNOSIS — Z20822 Contact with and (suspected) exposure to covid-19: Secondary | ICD-10-CM | POA: Insufficient documentation

## 2021-07-09 DIAGNOSIS — M4802 Spinal stenosis, cervical region: Principal | ICD-10-CM | POA: Insufficient documentation

## 2021-07-09 DIAGNOSIS — Z419 Encounter for procedure for purposes other than remedying health state, unspecified: Secondary | ICD-10-CM

## 2021-07-09 HISTORY — DX: Personal history of urinary calculi: Z87.442

## 2021-07-09 HISTORY — PX: ANTERIOR CERVICAL DECOMP/DISCECTOMY FUSION: SHX1161

## 2021-07-09 LAB — TYPE AND SCREEN
ABO/RH(D): O POS
Antibody Screen: NEGATIVE

## 2021-07-09 LAB — SURGICAL PCR SCREEN
MRSA, PCR: NEGATIVE
Staphylococcus aureus: NEGATIVE

## 2021-07-09 LAB — ABO/RH: ABO/RH(D): O POS

## 2021-07-09 LAB — SARS CORONAVIRUS 2 BY RT PCR (HOSPITAL ORDER, PERFORMED IN ~~LOC~~ HOSPITAL LAB): SARS Coronavirus 2: NEGATIVE

## 2021-07-09 IMAGING — RF DG CERVICAL SPINE 1V
1 series · 1 of 1 positions shown · non-contrast
Comparison: Cervical spine MRI [DATE].

CLINICAL DATA: C3-4 anterior cervical discectomy and fusion.

EXAM:
DG CERVICAL SPINE - 1 VIEW

[Series 1: run · 1 of 1 slices shown]
[im 1/1]
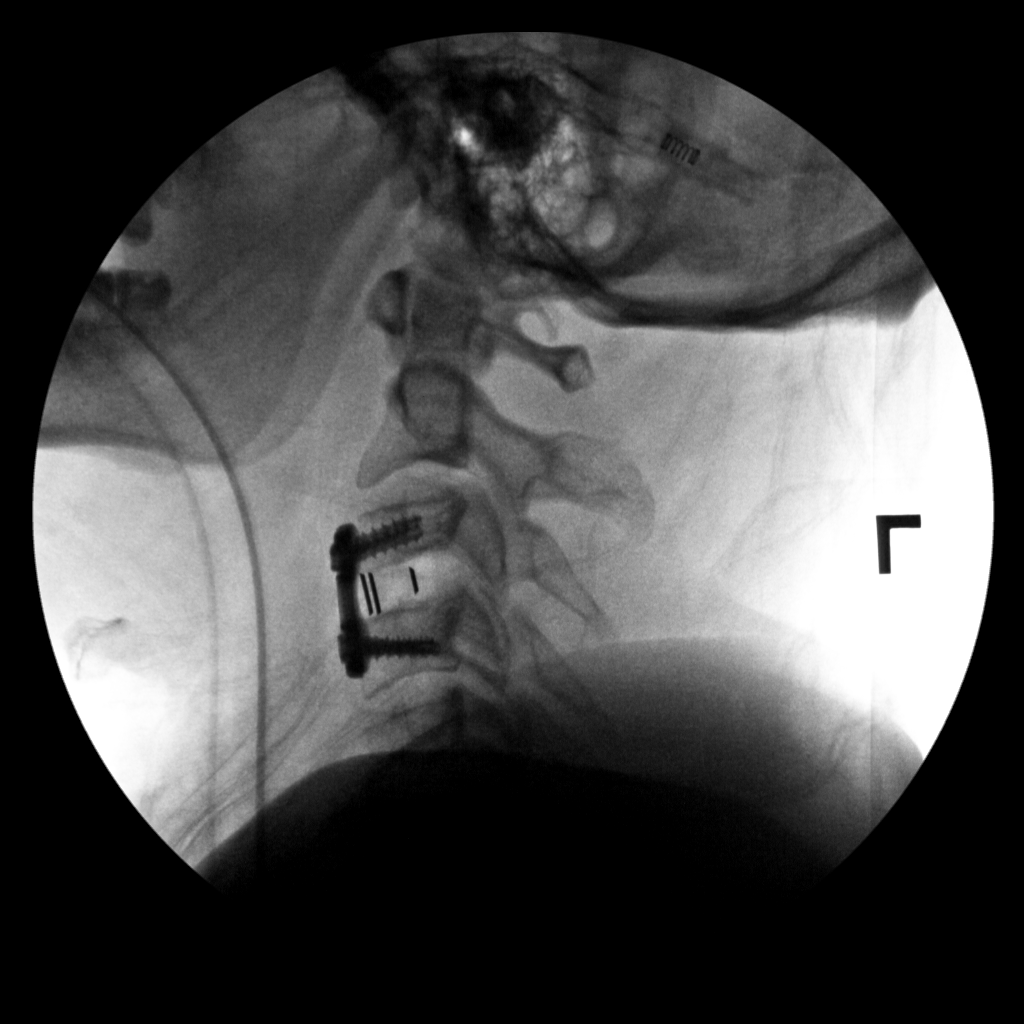

[1 of 1 positions shown; findings below may reference images not displayed]

FINDINGS: Intraoperative cervical spine.

1 low resolution intraoperative spot views of the cervical spine
were obtained. C3-C4 anterior cervical fusion plate present. No
fracture visible on the limited views.

Total fluoroscopy time: 5 seconds

Total radiation dose: 0.39 micro Gy
IMPRESSION: Intraoperative C3-C4 anterior fusion.

## 2021-07-09 SURGERY — ANTERIOR CERVICAL DECOMPRESSION/DISCECTOMY FUSION 1 LEVEL
Anesthesia: General

## 2021-07-09 MED ORDER — HEMOSTATIC AGENTS (NO CHARGE) OPTIME
TOPICAL | Status: DC | PRN
  Administered 2021-07-09: 1 via TOPICAL

## 2021-07-09 MED ORDER — ACETAMINOPHEN 325 MG PO TABS: 650.0000 mg | ORAL_TABLET | ORAL | Status: DC | PRN

## 2021-07-09 MED ORDER — LIDOCAINE 2% (20 MG/ML) 5 ML SYRINGE
INTRAMUSCULAR | Status: DC | PRN
  Administered 2021-07-09: 60 mg via INTRAVENOUS

## 2021-07-09 MED ORDER — DEXAMETHASONE SODIUM PHOSPHATE 10 MG/ML IJ SOLN
INTRAMUSCULAR | Status: DC | PRN
  Administered 2021-07-09: 10 mg via INTRAVENOUS

## 2021-07-09 MED ORDER — HYDROMORPHONE HCL 1 MG/ML IJ SOLN: 1.0000 mg | INTRAMUSCULAR | Status: DC | PRN

## 2021-07-09 MED ORDER — PROPOFOL 10 MG/ML IV BOLUS
INTRAVENOUS | Status: AC
  Filled 2021-07-09: qty 20

## 2021-07-09 MED ORDER — SODIUM CHLORIDE 0.9% FLUSH: 3.0000 mL | Freq: Two times a day (BID) | INTRAVENOUS | Status: DC

## 2021-07-09 MED ORDER — CEFAZOLIN SODIUM-DEXTROSE 2-4 GM/100ML-% IV SOLN
2.0000 g | INTRAVENOUS | Status: AC
  Administered 2021-07-09: 2 g via INTRAVENOUS

## 2021-07-09 MED ORDER — CHLORHEXIDINE GLUCONATE 0.12 % MT SOLN
OROMUCOSAL | Status: AC
  Administered 2021-07-09: 15 mL via OROMUCOSAL
  Filled 2021-07-09: qty 15

## 2021-07-09 MED ORDER — THROMBIN 5000 UNITS EX SOLR
CUTANEOUS | Status: AC
  Filled 2021-07-09: qty 5000

## 2021-07-09 MED ORDER — DEXMEDETOMIDINE (PRECEDEX) IN NS 20 MCG/5ML (4 MCG/ML) IV SYRINGE
PREFILLED_SYRINGE | INTRAVENOUS | Status: DC | PRN
  Administered 2021-07-09: 8 ug via INTRAVENOUS

## 2021-07-09 MED ORDER — ONDANSETRON HCL 4 MG/2ML IJ SOLN: 4.0000 mg | Freq: Four times a day (QID) | INTRAMUSCULAR | Status: DC | PRN

## 2021-07-09 MED ORDER — ESMOLOL HCL 100 MG/10ML IV SOLN
INTRAVENOUS | Status: DC | PRN
  Administered 2021-07-09: 30 ug via INTRAVENOUS

## 2021-07-09 MED ORDER — FENTANYL CITRATE (PF) 100 MCG/2ML IJ SOLN
INTRAMUSCULAR | Status: AC
  Filled 2021-07-09: qty 2

## 2021-07-09 MED ORDER — CHLORHEXIDINE GLUCONATE CLOTH 2 % EX PADS: 6.0000 | MEDICATED_PAD | Freq: Once | CUTANEOUS | Status: DC

## 2021-07-09 MED ORDER — MENTHOL 3 MG MT LOZG: 1.0000 | LOZENGE | OROMUCOSAL | Status: DC | PRN

## 2021-07-09 MED ORDER — MIDAZOLAM HCL 2 MG/2ML IJ SOLN
INTRAMUSCULAR | Status: AC
  Filled 2021-07-09: qty 2

## 2021-07-09 MED ORDER — OXYCODONE HCL 5 MG PO TABS: 5.0000 mg | ORAL_TABLET | Freq: Once | ORAL | Status: DC | PRN

## 2021-07-09 MED ORDER — PHENYLEPHRINE 40 MCG/ML (10ML) SYRINGE FOR IV PUSH (FOR BLOOD PRESSURE SUPPORT)
PREFILLED_SYRINGE | INTRAVENOUS | Status: DC | PRN
Start: 2021-07-09 — End: 2021-07-09
  Administered 2021-07-09 (×2): 120 ug via INTRAVENOUS

## 2021-07-09 MED ORDER — PHENOL 1.4 % MT LIQD: 1.0000 | OROMUCOSAL | Status: DC | PRN

## 2021-07-09 MED ORDER — OXYCODONE HCL 5 MG/5ML PO SOLN: 5.0000 mg | Freq: Once | ORAL | Status: DC | PRN

## 2021-07-09 MED ORDER — THROMBIN 5000 UNITS EX SOLR
CUTANEOUS | Status: DC | PRN
  Administered 2021-07-09 (×2): 5000 [IU] via TOPICAL

## 2021-07-09 MED ORDER — CEFAZOLIN SODIUM-DEXTROSE 1-4 GM/50ML-% IV SOLN
1.0000 g | Freq: Three times a day (TID) | INTRAVENOUS | Status: AC
  Administered 2021-07-09 – 2021-07-10 (×2): 1 g via INTRAVENOUS
  Filled 2021-07-09 (×2): qty 50

## 2021-07-09 MED ORDER — SODIUM CHLORIDE 0.9% FLUSH: 3.0000 mL | INTRAVENOUS | Status: DC | PRN

## 2021-07-09 MED ORDER — HYDROCODONE-ACETAMINOPHEN 5-325 MG PO TABS
1.0000 | ORAL_TABLET | ORAL | Status: DC | PRN
  Administered 2021-07-10: 1 via ORAL
  Filled 2021-07-09: qty 1

## 2021-07-09 MED ORDER — ONDANSETRON HCL 4 MG PO TABS: 4.0000 mg | ORAL_TABLET | Freq: Four times a day (QID) | ORAL | Status: DC | PRN

## 2021-07-09 MED ORDER — LACTATED RINGERS IV SOLN: INTRAVENOUS | Status: DC

## 2021-07-09 MED ORDER — ROCURONIUM BROMIDE 10 MG/ML (PF) SYRINGE
PREFILLED_SYRINGE | INTRAVENOUS | Status: DC | PRN
  Administered 2021-07-09: 20 mg via INTRAVENOUS
  Administered 2021-07-09: 60 mg via INTRAVENOUS

## 2021-07-09 MED ORDER — FENTANYL CITRATE (PF) 250 MCG/5ML IJ SOLN
INTRAMUSCULAR | Status: DC | PRN
  Administered 2021-07-09: 100 ug via INTRAVENOUS
  Administered 2021-07-09 (×2): 50 ug via INTRAVENOUS

## 2021-07-09 MED ORDER — EPHEDRINE SULFATE-NACL 50-0.9 MG/10ML-% IV SOSY
PREFILLED_SYRINGE | INTRAVENOUS | Status: DC | PRN
Start: 2021-07-09 — End: 2021-07-09
  Administered 2021-07-09: 5 mg via INTRAVENOUS
  Administered 2021-07-09: 10 mg via INTRAVENOUS

## 2021-07-09 MED ORDER — MIDAZOLAM HCL 5 MG/5ML IJ SOLN
INTRAMUSCULAR | Status: DC | PRN
Start: 2021-07-09 — End: 2021-07-09
  Administered 2021-07-09: 2 mg via INTRAVENOUS

## 2021-07-09 MED ORDER — CEFAZOLIN SODIUM-DEXTROSE 2-4 GM/100ML-% IV SOLN
INTRAVENOUS | Status: AC
  Filled 2021-07-09: qty 100

## 2021-07-09 MED ORDER — FENTANYL CITRATE (PF) 250 MCG/5ML IJ SOLN
INTRAMUSCULAR | Status: AC
  Filled 2021-07-09: qty 5

## 2021-07-09 MED ORDER — HYDROCODONE-ACETAMINOPHEN 10-325 MG PO TABS
2.0000 | ORAL_TABLET | ORAL | Status: DC | PRN
  Administered 2021-07-09: 2 via ORAL
  Filled 2021-07-09: qty 2

## 2021-07-09 MED ORDER — ACETAMINOPHEN 650 MG RE SUPP: 650.0000 mg | RECTAL | Status: DC | PRN

## 2021-07-09 MED ORDER — PROPOFOL 10 MG/ML IV BOLUS
INTRAVENOUS | Status: DC | PRN
  Administered 2021-07-09: 160 mg via INTRAVENOUS

## 2021-07-09 MED ORDER — ONDANSETRON HCL 4 MG/2ML IJ SOLN
INTRAMUSCULAR | Status: DC | PRN
  Administered 2021-07-09: 4 mg via INTRAVENOUS

## 2021-07-09 MED ORDER — SUGAMMADEX SODIUM 200 MG/2ML IV SOLN
INTRAVENOUS | Status: DC | PRN
Start: 2021-07-09 — End: 2021-07-09
  Administered 2021-07-09: 200 mg via INTRAVENOUS

## 2021-07-09 MED ORDER — FENTANYL CITRATE (PF) 100 MCG/2ML IJ SOLN
25.0000 ug | INTRAMUSCULAR | Status: DC | PRN
  Administered 2021-07-09 (×2): 25 ug via INTRAVENOUS

## 2021-07-09 MED ORDER — 0.9 % SODIUM CHLORIDE (POUR BTL) OPTIME
TOPICAL | Status: DC | PRN
  Administered 2021-07-09: 1000 mL

## 2021-07-09 MED ORDER — ORAL CARE MOUTH RINSE: 15.0000 mL | Freq: Once | OROMUCOSAL | Status: AC

## 2021-07-09 MED ORDER — PHENYLEPHRINE HCL-NACL 20-0.9 MG/250ML-% IV SOLN
INTRAVENOUS | Status: DC | PRN
  Administered 2021-07-09: 30 ug/min via INTRAVENOUS

## 2021-07-09 MED ORDER — CYCLOBENZAPRINE HCL 10 MG PO TABS
10.0000 mg | ORAL_TABLET | Freq: Three times a day (TID) | ORAL | Status: DC | PRN
  Administered 2021-07-10: 10 mg via ORAL
  Filled 2021-07-09: qty 1

## 2021-07-09 MED ORDER — SODIUM CHLORIDE 0.9 % IV SOLN: 250.0000 mL | INTRAVENOUS | Status: DC

## 2021-07-09 MED ORDER — CHLORHEXIDINE GLUCONATE 0.12 % MT SOLN: 15.0000 mL | Freq: Once | OROMUCOSAL | Status: AC

## 2021-07-09 SURGICAL SUPPLY — 50 items
BAG COUNTER SPONGE SURGICOUNT (BAG) ×2 IMPLANT
BAG DECANTER FOR FLEXI CONT (MISCELLANEOUS) ×2 IMPLANT
BAND RUBBER #18 3X1/16 STRL (MISCELLANEOUS) ×4 IMPLANT
BENZOIN TINCTURE PRP APPL 2/3 (GAUZE/BANDAGES/DRESSINGS) ×2 IMPLANT
BIT DRILL 13 (BIT) ×2 IMPLANT
BUR MATCHSTICK NEURO 3.0 LAGG (BURR) ×2 IMPLANT
CAGE PEEK 7X14X11 (Cage) ×1 IMPLANT
CANISTER SUCT 3000ML PPV (MISCELLANEOUS) ×2 IMPLANT
CARTRIDGE OIL MAESTRO DRILL (MISCELLANEOUS) ×1 IMPLANT
CLSR STERI-STRIP ANTIMIC 1/2X4 (GAUZE/BANDAGES/DRESSINGS) ×2 IMPLANT
DIFFUSER DRILL AIR PNEUMATIC (MISCELLANEOUS) ×2 IMPLANT
DRAPE C-ARM 42X72 X-RAY (DRAPES) ×4 IMPLANT
DRAPE LAPAROTOMY 100X72 PEDS (DRAPES) ×2 IMPLANT
DRAPE MICROSCOPE LEICA (MISCELLANEOUS) ×2 IMPLANT
DURAPREP 6ML APPLICATOR 50/CS (WOUND CARE) ×2 IMPLANT
ELECT COATED BLADE 2.86 ST (ELECTRODE) ×2 IMPLANT
ELECT REM PT RETURN 9FT ADLT (ELECTROSURGICAL) ×2
ELECTRODE REM PT RTRN 9FT ADLT (ELECTROSURGICAL) ×1 IMPLANT
GAUZE 4X4 16PLY ~~LOC~~+RFID DBL (SPONGE) IMPLANT
GAUZE SPONGE 4X4 12PLY STRL (GAUZE/BANDAGES/DRESSINGS) ×2 IMPLANT
GLOVE EXAM NITRILE XL STR (GLOVE) IMPLANT
GLOVE SURG LTX SZ9 (GLOVE) ×2 IMPLANT
GOWN STRL REUS W/ TWL LRG LVL3 (GOWN DISPOSABLE) IMPLANT
GOWN STRL REUS W/ TWL XL LVL3 (GOWN DISPOSABLE) IMPLANT
GOWN STRL REUS W/TWL 2XL LVL3 (GOWN DISPOSABLE) IMPLANT
GOWN STRL REUS W/TWL LRG LVL3 (GOWN DISPOSABLE)
GOWN STRL REUS W/TWL XL LVL3 (GOWN DISPOSABLE)
HALTER HD/CHIN CERV TRACTION D (MISCELLANEOUS) ×2 IMPLANT
KIT BASIN OR (CUSTOM PROCEDURE TRAY) ×2 IMPLANT
KIT TURNOVER KIT B (KITS) ×2 IMPLANT
NEEDLE SPNL 20GX3.5 QUINCKE YW (NEEDLE) ×2 IMPLANT
NS IRRIG 1000ML POUR BTL (IV SOLUTION) ×2 IMPLANT
OIL CARTRIDGE MAESTRO DRILL (MISCELLANEOUS) ×2
PACK LAMINECTOMY NEURO (CUSTOM PROCEDURE TRAY) ×2 IMPLANT
PAD ARMBOARD 7.5X6 YLW CONV (MISCELLANEOUS) ×6 IMPLANT
PLATE ELITE VISION 25MM (Plate) ×2 IMPLANT
SCREW ST 13X4XST VA NS SPNE (Screw) ×4 IMPLANT
SCREW ST VAR 4 ATL (Screw) ×4 IMPLANT
SPACER SPNL 11X14X7XPEEK CVD (Cage) ×1 IMPLANT
SPCR SPNL 11X14X7XPEEK CVD (Cage) ×1 IMPLANT
SPONGE INTESTINAL PEANUT (DISPOSABLE) ×2 IMPLANT
SPONGE SURGIFOAM ABS GEL SZ50 (HEMOSTASIS) ×2 IMPLANT
STRIP CLOSURE SKIN 1/2X4 (GAUZE/BANDAGES/DRESSINGS) ×2 IMPLANT
SUT VIC AB 3-0 SH 8-18 (SUTURE) ×2 IMPLANT
SUT VIC AB 4-0 RB1 18 (SUTURE) ×2 IMPLANT
TAPE CLOTH 4X10 WHT NS (GAUZE/BANDAGES/DRESSINGS) ×2 IMPLANT
TOWEL GREEN STERILE (TOWEL DISPOSABLE) ×2 IMPLANT
TOWEL GREEN STERILE FF (TOWEL DISPOSABLE) ×2 IMPLANT
TRAP SPECIMEN MUCUS 40CC (MISCELLANEOUS) ×2 IMPLANT
WATER STERILE IRR 1000ML POUR (IV SOLUTION) ×2 IMPLANT

## 2021-07-09 NOTE — Anesthesia Preprocedure Evaluation (Signed)
Anesthesia Evaluation  Patient identified by MRN, date of birth, ID band Patient awake    Reviewed: Allergy & Precautions, H&P , NPO status , Patient's Chart, lab work & pertinent test results  Airway Mallampati: II   Neck ROM: full    Dental   Pulmonary Current Smoker,    breath sounds clear to auscultation       Cardiovascular negative cardio ROS   Rhythm:regular Rate:Normal     Neuro/Psych    GI/Hepatic   Endo/Other    Renal/GU stones     Musculoskeletal   Abdominal   Peds  Hematology   Anesthesia Other Findings   Reproductive/Obstetrics                             Anesthesia Physical Anesthesia Plan  ASA: 2  Anesthesia Plan: General   Post-op Pain Management:    Induction: Intravenous  PONV Risk Score and Plan: 1 and Ondansetron, Dexamethasone, Midazolam and Treatment may vary due to age or medical condition  Airway Management Planned: Oral ETT  Additional Equipment:   Intra-op Plan:   Post-operative Plan: Extubation in OR  Informed Consent: I have reviewed the patients History and Physical, chart, labs and discussed the procedure including the risks, benefits and alternatives for the proposed anesthesia with the patient or authorized representative who has indicated his/her understanding and acceptance.     Dental advisory given  Plan Discussed with: CRNA, Anesthesiologist and Surgeon  Anesthesia Plan Comments:         Anesthesia Quick Evaluation

## 2021-07-09 NOTE — Brief Op Note (Signed)
07/09/2021  3:28 PM  PATIENT:  Glenn Vasquez  30 y.o. male  PRE-OPERATIVE DIAGNOSIS:  Stenosis of cervical spine with myelopathy  POST-OPERATIVE DIAGNOSIS:  Stenosis of cervical spine with myelopathy  PROCEDURE:  Procedure(s): Cervical three-four  Anterior Cervical Discectomy Fusion (N/A)  SURGEON:  Surgeon(s) and Role:    Julio Sicks, MD - Primary  PHYSICIAN ASSISTANT:   ASSISTANTSDoran Durand ,NP  ANESTHESIA:   general  EBL:  75cc   BLOOD ADMINISTERED:none  DRAINS: none   LOCAL MEDICATIONS USED:  NONE  SPECIMEN:  No Specimen  DISPOSITION OF SPECIMEN:  N/A  COUNTS:  YES  TOURNIQUET:  * No tourniquets in log *  DICTATION: .Dragon Dictation  PLAN OF CARE: Admit for overnight observation  PATIENT DISPOSITION:  PACU - hemodynamically stable.   Delay start of Pharmacological VTE agent (>24hrs) due to surgical blood loss or risk of bleeding: yes

## 2021-07-09 NOTE — Progress Notes (Signed)
Orthopedic Tech Progress Note Patient Details:  Glenn Vasquez 22-Feb-1991 [id number]  Ortho Devices Type of Ortho Device: Aspen cervical collar Ortho Device/Splint Interventions: Ordered  I dropped off cervical collar to the patient.    Al Decant 07/09/2021, 4:43 PM

## 2021-07-09 NOTE — H&P (Signed)
  Glenn Vasquez is an 29 y.o. male.   Chief Complaint: Weakness HPI: 30 year old male with progressive bilateral upper extremity numbness weakness and loss of motor control.  Patient also with significant gait abnormality.  Work-up demonstrates evidence of severe stenosis at C3-4 with marked cord compression and high signal abnormality within the cord.  He has some spondylosis off to the left-sided C2-3 which does not cause significant cord compression.  Patient presents now for C3-4 anterior cervical discectomy and fusion in hopes of improving his symptoms.  Past Medical History:  Diagnosis Date   History of kidney stones     Past Surgical History:  Procedure Laterality Date   URETEROLITHOTOMY     kidney stone    Family History  Problem Relation Age of Onset   Hypertension Mother    Social History:  reports that he has been smoking cigarettes. He has a 3.50 pack-year smoking history. He has never used smokeless tobacco. He reports current alcohol use of about 10.0 standard drinks per week. He reports that he does not use drugs.  Allergies: No Known Allergies  Medications Prior to Admission  Medication Sig Dispense Refill   methylPREDNISolone (MEDROL DOSEPAK) 4 MG TBPK tablet Take as directed on package (Patient not taking: Reported on 07/07/2021) 21 tablet 0    Results for orders placed or performed during the hospital encounter of 07/09/21 (from the past 48 hour(s))  Type and screen     Status: None (Preliminary result)   Collection Time: 07/09/21 12:31 PM  Result Value Ref Range   ABO/RH(D) PENDING    Antibody Screen PENDING    Sample Expiration      07/12/2021,2359 Performed at [facility], [address], [place], [place] [address]    No results found.  Pertinent items noted in HPI and remainder of comprehensive ROS otherwise negative.  Blood pressure 121/64, pulse 90, temperature 97.8 F (36.6 C), temperature source Oral, resp. rate 18, height 5\' 6"  (1.676  m), weight 79.8 kg, SpO2 99 %.  Patient is awake and alert.  He is oriented and appropriate.  Speech is fluent.  Judgment insight are intact.  Cranial nerve function normal bilateral.  Motor examination of the extremities reveals significant grip weakness bilaterally grading at 4/5.  His intrinsic weakness grading at 4/5.  He has lower extremity weakness grading at 4+/5 with increased tone.  He is hyperreflexic.  He has Hoffmann's responses in both hands.  Sensory examination reveals decreased sensation distally in both upper extremities.  Gait is spastic and unsteady.  Examination head ears eyes nose throat is unremarked.  Chest and abdomen are benign.  Extremities are free of major deformity. Assessment/Plan C3-4 stenosis with myelopathy.  Plan C3-4 anterior cervical discectomy with interbody fusion utilizing interbody cage, will curbside autograft, and anterior plate is rotation.  Risks and benefits been explained.  Patient wishes to proceed.  A Tristan Proto 07/09/2021, 1:18 PM

## 2021-07-09 NOTE — Op Note (Signed)
Date of procedure: 07/09/2021  Date of dictation: Same  Service: Neurosurgery  Preoperative diagnosis: C3-4 stenosis with myelopathy  Postoperative diagnosis: Same  Procedure Name: C3-4 anterior cervical discectomy with interbody fusion utilizing interbody cage, local harvested autograft, anterior plate instrumentation.  Surgeon:Natoshia Souter A.Anessa Charley, M.D.  Asst. Surgeon: Doran Durand, NP  Anesthesia: General  Indication: 30 year old male with progressive bilateral upper extremity numbness and weakness with increasing gait disturbance.  Work-up demonstrates evidence of severe stenosis at C3-4 secondary to a broad-based disc herniation at C3-4.  There is marked cord compression with ongoing high signal abnormality within the cord.  Should he has some early stenosis at C2-3 remainder of her cervical spine demonstrates some mild bulging at C5-6.  Patient presents now for C3-4 anterior cervical discectomy and fusion in hopes of improving his symptoms.  Operative note: After induction anesthesia, patient edition supine with neck slightly extended held placed halter traction.  Patient's anterior cervical region prepped draped sterilely.  Incision made overlying C3-4.  Dissection performed on the right.  Retractor placed.  Fluoroscopy used.  Levels confirmed.  Dissipates in size.  Discectomy performed using various instruments down to level the posterior annulus.  Microscope then brought to field used out the remainder of the discectomy.  Remaining aspects of annulus and osteophytes removed using high-speed drill down to level posterior longitudinal ligament.  Ligament was then elevated and resected piecemeal fashion underlying thecal sac was then identified.  Wide central decompression then performed undercutting the bodies of C3 and C4.  Decompression then proceeded external foramina.  Wide anterior foraminotomies performed on the the course of the exiting nerve roots bilaterally.  At this point a very thorough  decompression been achieved.  There was no evidence of injury to the thecal sac or nerve roots.  Wound is then irrigated.  Gelfoam was placed topically for hemostasis then removed.  7 mm Medtronic anatomic peek cage was then packed with locally harvested autograft and packed into place.  The cage was recessed slightly from the anterior cortical margin.  25 mm Atlantis anterior cervical plate was then placed over the C3 and C4 levels.  This then attached under fluoroscopic guidance using 13 mm variable angle screws to each of both levels.  All 4 screws given final tightening and locking screws were engaged.  Final images reveal good fusion cages at the hardware with proper operative level with normal alignment of spine.  Wound is inspected for hemostasis which was found to be good.  Wounds and closed in layers with Vicryl sutures.  Steri-Strips and sterile dressing were applied.  No apparent complications.  Patient tolerated the procedure well and he returns to recovery room postop.

## 2021-07-09 NOTE — Transfer of Care (Signed)
Immediate Anesthesia Transfer of Care Note  Patient: Glenn Vasquez  Procedure(s) Performed: Cervical three-four  Anterior Cervical Discectomy Fusion  Patient Location: PACU  Anesthesia Type:General  Level of Consciousness: awake, alert  and oriented  Airway & Oxygen Therapy: Patient Spontanous Breathing and Patient connected to face mask oxygen  Post-op Assessment: Report given to RN and Post -op Vital signs reviewed and stable  Post vital signs: Reviewed and stable  Last Vitals:  Vitals Value Taken Time  BP 130/93 07/09/21 1544  Temp    Pulse 110 07/09/21 1548  Resp 20 07/09/21 1548  SpO2 97 % 07/09/21 1548  Vitals shown include unvalidated device data.  Last Pain:  Vitals:   07/09/21 1230  TempSrc:   PainSc: 0-No pain      Patients Stated Pain Goal: 2 (07/09/21 1230)  Complications: No notable events documented.

## 2021-07-09 NOTE — Anesthesia Procedure Notes (Signed)
Procedure Name: Intubation Date/Time: 07/09/2021 2:02 PM Performed by: Macie Burows, CRNA Pre-anesthesia Checklist: Patient identified, Emergency Drugs available, Suction available and Patient being monitored Patient Re-evaluated:Patient Re-evaluated prior to induction Oxygen Delivery Method: Circle system utilized Preoxygenation: Pre-oxygenation with 100% oxygen Induction Type: IV induction Ventilation: Mask ventilation without difficulty and Oral airway inserted - appropriate to patient size Laryngoscope Size: Glidescope and 4 Grade View: Grade I Tube type: Oral Tube size: 7.5 mm Number of attempts: 1 Airway Equipment and Method: Rigid stylet and Video-laryngoscopy Placement Confirmation: ETT inserted through vocal cords under direct vision, positive ETCO2 and breath sounds checked- equal and bilateral Secured at: 24 cm Tube secured with: Tape Dental Injury: Teeth and Oropharynx as per pre-operative assessment

## 2021-07-10 MED ORDER — HYDROCODONE-ACETAMINOPHEN 5-325 MG PO TABS: 1.0000 | ORAL_TABLET | ORAL | 0 refills | Status: AC | PRN

## 2021-07-10 MED ORDER — CYCLOBENZAPRINE HCL 10 MG PO TABS: 10.0000 mg | ORAL_TABLET | Freq: Three times a day (TID) | ORAL | 0 refills | Status: AC | PRN

## 2021-07-10 NOTE — Evaluation (Signed)
Occupational Therapy Evaluation Patient Details Name: Glenn Vasquez MRN: [id number] DOB: August 03, 1991 Today's Date: 07/10/2021   History of Present Illness Pt is a 30 y.o. M who presents with progressive BUE numbness, weakness, and gait abnormality. Work up revealed severe stenosis at C3-4 with marked cord compression. Now s/p C3-4 ACDF 07/09/2021. Significant PMH: none.   Clinical Impression   Pt presents with decreased balance. Pt demonstrated ability to complete ADLs safely without assistance. Pt reports numbness in digits on B hands, however not impairing occupational performance. Pt educated on precautions and compensatory strategies for ADLs. Pt should be safe to return home without any further skilled OT needs once medically cleared. Will sign off.     Recommendations for follow up therapy are one component of a multi-disciplinary discharge planning process, led by the attending physician.  Recommendations may be updated based on patient status, additional functional criteria and insurance authorization.   Follow Up Recommendations  No OT follow up    Assistance Recommended at Discharge None  Functional Status Assessment  Patient has had a recent decline in their functional status and demonstrates the ability to make significant improvements in function in a reasonable and predictable amount of time.  Equipment Recommendations  None recommended by OT    Recommendations for Other Services       Precautions / Restrictions Precautions Precautions: Cervical;Fall Precaution Booklet Issued: Yes (comment) Precaution Comments: verbally reviewed, provided written handout to pt friend who can read [place] Required Braces or Orthoses: Cervical Brace Cervical Brace: Soft collar Restrictions Weight Bearing Restrictions: No      Mobility Bed Mobility Overal bed mobility: Modified Independent             General bed mobility comments: cues for log roll technique, HOB flat     Transfers Overall transfer level: Independent Equipment used: None                      Balance Overall balance assessment: Mild deficits observed, not formally tested                                         ADL either performed or assessed with clinical judgement   ADL Overall ADL's : Modified independent                                       General ADL Comments: Demonstrated ability to independently/safely  complete ADLs with extra time while maintaining precautions.     Vision Baseline Vision/History: 1 Wears glasses       Perception     Praxis      Pertinent Vitals/Pain Pain Assessment: Faces Faces Pain Scale: Hurts little more Pain Location: surgical site Pain Descriptors / Indicators: Operative site guarding Pain Intervention(s): Monitored during session     Hand Dominance     Extremity/Trunk Assessment Upper Extremity Assessment Upper Extremity Assessment: Overall WFL for tasks assessed;RUE deficits/detail;LUE deficits/detail RUE Sensation: decreased light touch LUE Sensation: decreased light touch   Lower Extremity Assessment Lower Extremity Assessment: Defer to PT evaluation   Cervical / Trunk Assessment Cervical / Trunk Assessment: Neck Surgery   Communication Communication Communication: Prefers language other than English   Cognition Arousal/Alertness: Awake/alert Behavior During Therapy: WFL for tasks assessed/performed Overall Cognitive Status: Within Functional Limits for tasks assessed  General Comments       Exercises     Shoulder Instructions      Home Living Family/patient expects to be discharged to:: Private residence Living Arrangements: Non-relatives/Friends (friend/roomates) Available Help at Discharge: Friend(s) Type of Home: Other(Comment) (condo) Home Access: Stairs to enter Entrance Stairs-Number of Steps: 1   Home  Layout: Able to live on main level with bedroom/bathroom                          Prior Functioning/Environment Prior Level of Function : Independent/Modified Independent             Mobility Comments: 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          OT Problem List: Impaired balance (sitting and/or standing);Decreased knowledge of precautions      OT Treatment/Interventions:      OT Goals(Current goals can be found in the care plan section) Acute Rehab OT Goals Patient Stated Goal: return home OT Goal Formulation: With patient/family  OT Frequency:     Barriers to D/Vasquez:            Co-evaluation              AM-PAC OT "6 Clicks" Daily Activity     Outcome Measure Help from another person eating meals?: None Help from another person taking care of personal grooming?: None Help from another person toileting, which includes using toliet, bedpan, or urinal?: None Help from another person bathing (including washing, rinsing, drying)?: None Help from another person to put on and taking off regular upper body clothing?: None Help from another person to put on and taking off regular lower body clothing?: None 6 Click Score: 24   End of Session Equipment Utilized During Treatment: Cervical collar Nurse Communication: Mobility status  Activity Tolerance: Patient tolerated treatment well Patient left: in bed;with call bell/phone within reach;with family/visitor present  OT Visit Diagnosis: Unsteadiness on feet (R26.81)                Time: 5009-3818 OT Time Calculation (min): 14 min Charges:  OT General Charges $OT Visit: 1 Visit OT Evaluation $OT Eval Low Complexity: 1 Low  Glenn Vasquez, OT/L  Acute Rehab [phone]  Glenn Vasquez 07/10/2021, 9:47 AM

## 2021-07-10 NOTE — Evaluation (Signed)
Physical Therapy Evaluation and Discharge Patient Details Name: Glenn Vasquez MRN: [id number] DOB: [birth year] Today's Date: 07/10/2021  History of Present Illness  Pt is a 30 y.o. M who presents with progressive BUE numbness, weakness, and gait abnormality. Work up revealed severe stenosis at C3-4 with marked cord compression. Now s/p C3-4 ACDF 07/09/2021. Significant PMH: none.  Clinical Impression  Pt admitted s/p procedure listed above. On PT assessment, pt denies radicular pain, but reports numbness in bilateral fingertips. He displays 5/5 strength in all extremities. He continues with a somewhat spastic gait pattern, with increased left leg adduction in swing phase and overall guarded and slow pace for his age. However, he is able to ambulate hallway distances and negotiated steps without physical difficulty or assist. Education reviewed regarding cervical precautions, brace use, activity recommendations with both pt and pt friend who will be assisting. No further acute PT needs. Thank you for this consult.     Recommendations for follow up therapy are one component of a multi-disciplinary discharge planning process, led by the attending physician.  Recommendations may be updated based on patient status, additional functional criteria and insurance authorization.  Follow Up Recommendations No PT follow up    Assistance Recommended at Discharge Intermittent Supervision/Assistance  Functional Status Assessment Patient has had a recent decline in their functional status and demonstrates the ability to make significant improvements in function in a reasonable and predictable amount of time.  Equipment Recommendations  None recommended by PT    Recommendations for Other Services       Precautions / Restrictions Precautions Precautions: Cervical;Fall Precaution Booklet Issued: Yes (comment) Precaution Comments: verbally reviewed, provided written handout to pt friend who can read  [place] Required Braces or Orthoses: Cervical Brace Cervical Brace: Soft collar Restrictions Weight Bearing Restrictions: No      Mobility  Bed Mobility Overal bed mobility: Modified Independent             General bed mobility comments: cues for log roll technique, HOB flat    Transfers Overall transfer level: Independent Equipment used: None                    Ambulation/Gait Ambulation/Gait assistance: Modified independent (Device/Increase time) Gait Distance (Feet): 400 Feet Assistive device: None Gait Pattern/deviations: Step-through pattern;Decreased stride length;Ataxic Gait velocity: decreased   General Gait Details: Pt with guarded gait pattern, decreased reciprocal arm swing, LLE adduction during swing phase  Stairs Stairs: Yes Stairs assistance: Supervision Stair Management: One rail Left Number of Stairs: 3 General stair comments: cues for step by step  Wheelchair Mobility    Modified Rankin (Stroke Patients Only)       Balance Overall balance assessment: Mild deficits observed, not formally tested                                           Pertinent Vitals/Pain Pain Assessment: Faces Faces Pain Scale: Hurts little more Pain Location: surgical site Pain Descriptors / Indicators: Operative site guarding Pain Intervention(s): Monitored during session    Home Living Family/patient expects to be discharged to:: Private residence Living Arrangements: Non-relatives/Friends (friend/roomates) Available Help at Discharge: Friend(s) Type of Home: Other(Comment) (condo) Home Access: Stairs to enter   Entrance Stairs-Number of Steps: 1   Home Layout: Able to live on main level with bedroom/bathroom        Prior Function Prior Level of  Function : Independent/Modified Independent             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         Extremity/Trunk Assessment   Upper Extremity Assessment Upper Extremity Assessment: Defer to OT evaluation    Lower Extremity Assessment Lower Extremity Assessment: RLE deficits/detail;LLE deficits/detail RLE Deficits / Details: Strength 5/5 LLE Deficits / Details: Strength 5/5    Cervical / Trunk Assessment Cervical / Trunk Assessment: Neck Surgery  Communication   Communication: Prefers language other than English  Cognition Arousal/Alertness: Awake/alert Behavior During Therapy: WFL for tasks assessed/performed Overall Cognitive Status: Within Functional Limits for tasks assessed                                          General Comments      Exercises     Assessment/Plan    PT Assessment Patient does not need any further PT services  PT Problem List         PT Treatment Interventions      PT Goals (Current goals can be found in the Care Plan section)  Acute Rehab PT Goals Patient Stated Goal: return to baseline PT Goal Formulation: All assessment and education complete, DC therapy    Frequency     Barriers to discharge        Co-evaluation               AM-PAC PT "6 Clicks" Mobility  Outcome Measure Help needed turning from your back to your side while in a flat bed without using bedrails?: None Help needed moving from lying on your back to sitting on the side of a flat bed without using bedrails?: None Help needed moving to and from a bed to a chair (including a wheelchair)?: None Help needed standing up from a chair using your arms (e.g., wheelchair or bedside chair)?: None Help needed to walk in hospital room?: None Help needed climbing 3-5 steps with a railing? : A Little 6 Click Score: 23    End of Session Equipment Utilized During Treatment: Cervical collar Activity Tolerance: Patient tolerated treatment well Patient left: in bed;with call bell/phone within reach;with family/visitor present Nurse Communication: Mobility  status PT Visit Diagnosis: Other abnormalities of gait and mobility (R26.89)    Time: 2536-6440 PT Time Calculation (min) (ACUTE ONLY): 20 min   Charges:   PT Evaluation $PT Eval Low Complexity: 1 Low          Lillia Pauls, PT, DPT Acute Rehabilitation Services Pager [phone] Office [phone]   Glenn Vasquez 07/10/2021, 9:35 AM

## 2021-07-10 NOTE — Progress Notes (Signed)
Patient alert and oriented, mae's well, voiding adequate amount of urine, swallowing without difficulty, no c/o pain at time of discharge. Patient discharged home with family. Script and discharged instructions given to patient. Patient and family stated understanding of instructions given. Patient has an appointment with Dr. Pool in 2 weeks 

## 2021-07-10 NOTE — Discharge Instructions (Addendum)
Wound Care Keep incision covered and dry for two days.   Do not put any creams, lotions, or ointments on incision. Leave steri-strips on neck.  They will fall off by themselves. Activity Walk each and every day, increasing distance each day. No lifting greater than 5 lbs.  Avoid excessive neck motion. No driving for 2 weeks; may ride as a passenger locally. If provided with neck brace, wear at all times except for shower Diet Resume your normal diet.  Return to Work Will be discussed at you follow up appointment. Call Your Doctor If Any of These Occur Redness, drainage, or swelling at the wound.  Temperature greater than 101 degrees. Severe pain not relieved by pain medication. Incision starts to come apart. Follow Up Appt Call today for appointment in 1-2 weeks ([phone]) or for problems.  If you have any hardware placed in your spine, you will need an x-ray before your appointment.

## 2021-07-10 NOTE — Discharge Summary (Signed)
Physician Discharge Summary  Patient ID: Glenn Vasquez MRN: [id number] DOB/AGE: February 05, 1991 29 y.o.  Admit date: 07/09/2021 Discharge date: 07/10/2021  Admission Diagnoses:  Discharge Diagnoses:  Active Problems:   Cervical myelopathy [facility])   Discharged Condition: good  Hospital Course: Patient admitted to hospital where he underwent uncomplicated anterior cervical decompression and fusion surgery for treatment of his compressive cervical myelopathy postoperatively he is doing well.  His neck pain is minimal.  His upper and lower extremity strength and sensation are improved although he still has some distal numbness.  He is standing and walking better.  He is tolerating regular diet.  He is voiding well.  Consults:   Significant Diagnostic Studies:   Treatments: Discharge Exam: Blood pressure (!) 139/98, pulse 85, temperature 98.1 F (36.7 C), temperature source Oral, resp. rate 20, height 5\' 6"  (1.676 m), weight 79.8 kg, SpO2 99 %.  Awake and alert.  Oriented and appropriate.  Motor examination with some mild grip and intrinsic weakness improved from preop.  Wound clean and dry.  Chest and abdomen benign.  Gait reasonably steady.  Disposition: Discharge disposition: 01-Home or Self Care        Allergies as of 07/10/2021   No Known Allergies      Medication List     STOP taking these medications    methylPREDNISolone 4 MG Tbpk tablet Commonly known as: MEDROL DOSEPAK       TAKE these medications    cyclobenzaprine 10 MG tablet Commonly known as: FLEXERIL Take 1 tablet (10 mg total) by mouth 3 (three) times daily as needed for muscle spasms.   HYDROcodone-acetaminophen 5-325 MG tablet Commonly known as: NORCO/VICODIN Take 1 tablet by mouth every 4 (four) hours as needed for moderate pain ((score 4 to 6)).         Signed: 13/01/2021 Yanely Mast 07/10/2021, 9:55 AM

## 2021-07-11 NOTE — Anesthesia Postprocedure Evaluation (Signed)
Anesthesia Post Note  Patient: Vickey Ewbank  Procedure(s) Performed: Cervical three-four  Anterior Cervical Discectomy Fusion     Patient location during evaluation: PACU Anesthesia Type: General Level of consciousness: awake and alert Pain management: pain level controlled Vital Signs Assessment: post-procedure vital signs reviewed and stable Respiratory status: spontaneous breathing, nonlabored ventilation, respiratory function stable and patient connected to nasal cannula oxygen Cardiovascular status: blood pressure returned to baseline and stable Postop Assessment: no apparent nausea or vomiting Anesthetic complications: no   No notable events documented.  Last Vitals:  Vitals:   07/10/21 0406 07/10/21 0810  BP: (!) 139/99 (!) 139/98  Pulse: 85 85  Resp: 18 20  Temp: 36.7 C 36.7 C  SpO2: 98% 99%    Last Pain:  Vitals:   07/10/21 0810  TempSrc: Oral  PainSc:                  Jacobs Golab L Junko [place]

## 2021-07-13 ENCOUNTER — Encounter (HOSPITAL_COMMUNITY): Payer: Self-pay | Admitting: Neurosurgery
